# Patient Record
Sex: Female | Born: 1965 | Race: White | Hispanic: No | Marital: Married | Smoking: Former smoker
Health system: Southern US, Community
[De-identification: ages and names within clinical notes are randomized; demographics above are authoritative.]

## PROBLEM LIST (undated history)

## (undated) DIAGNOSIS — N870 Mild cervical dysplasia: Secondary | ICD-10-CM

## (undated) HISTORY — DX: Mild cervical dysplasia: N87.0

## (undated) HISTORY — PX: COLPOSCOPY: SHX161

## (undated) HISTORY — PX: CERVICAL BIOPSY  W/ LOOP ELECTRODE EXCISION: SUR135

---

## 2002-10-04 HISTORY — PX: AUGMENTATION MAMMAPLASTY: SUR837

## 2002-10-04 HISTORY — PX: ANTERIOR AND POSTERIOR VAGINAL REPAIR: SUR5

## 2003-07-15 ENCOUNTER — Other Ambulatory Visit: Admission: RE | Admit: 2003-07-15 | Discharge: 2003-07-15 | Payer: Self-pay | Admitting: Obstetrics and Gynecology

## 2004-01-20 ENCOUNTER — Emergency Department (HOSPITAL_COMMUNITY): Admission: EM | Admit: 2004-01-20 | Discharge: 2004-01-20 | Payer: Self-pay | Admitting: *Deleted

## 2004-09-16 ENCOUNTER — Encounter: Admission: RE | Admit: 2004-09-16 | Discharge: 2004-09-16 | Payer: Self-pay | Admitting: Obstetrics and Gynecology

## 2004-10-04 DIAGNOSIS — N870 Mild cervical dysplasia: Secondary | ICD-10-CM

## 2004-10-04 HISTORY — DX: Mild cervical dysplasia: N87.0

## 2005-10-20 ENCOUNTER — Encounter: Admission: RE | Admit: 2005-10-20 | Discharge: 2005-10-20 | Payer: Self-pay | Admitting: Obstetrics and Gynecology

## 2006-11-14 ENCOUNTER — Encounter: Admission: RE | Admit: 2006-11-14 | Discharge: 2006-11-14 | Payer: Self-pay | Admitting: Obstetrics and Gynecology

## 2007-11-16 ENCOUNTER — Encounter: Admission: RE | Admit: 2007-11-16 | Discharge: 2007-11-16 | Payer: Self-pay | Admitting: Internal Medicine

## 2008-03-20 ENCOUNTER — Other Ambulatory Visit: Admission: RE | Admit: 2008-03-20 | Discharge: 2008-03-20 | Payer: Self-pay | Admitting: Internal Medicine

## 2008-12-18 ENCOUNTER — Encounter: Admission: RE | Admit: 2008-12-18 | Discharge: 2008-12-18 | Payer: Self-pay | Admitting: Internal Medicine

## 2009-01-14 ENCOUNTER — Ambulatory Visit (HOSPITAL_COMMUNITY): Admission: RE | Admit: 2009-01-14 | Discharge: 2009-01-14 | Payer: Self-pay | Admitting: Internal Medicine

## 2009-05-05 ENCOUNTER — Ambulatory Visit: Payer: Self-pay | Admitting: Obstetrics and Gynecology

## 2009-05-05 ENCOUNTER — Other Ambulatory Visit: Admission: RE | Admit: 2009-05-05 | Discharge: 2009-05-05 | Payer: Self-pay | Admitting: Obstetrics and Gynecology

## 2009-05-05 ENCOUNTER — Encounter: Payer: Self-pay | Admitting: Obstetrics and Gynecology

## 2010-01-08 ENCOUNTER — Encounter: Admission: RE | Admit: 2010-01-08 | Discharge: 2010-01-08 | Payer: Self-pay | Admitting: Internal Medicine

## 2010-04-12 IMAGING — MG MM DIGITAL SCREENING W/ IMPLANTS
8 series · 8 of 8 positions shown · non-contrast
Comparison: none

DG SCREENING W/IMPLANTS
Bilateral CC and MLO view(s) were taken.
Technologist: Aoife Fg

DIGITAL SCREENING MAMMOGRAM W/IMPLANTS WITH CAD:
Saline implants are present in a submuscular location.  Standard and modified compression views are
obtained.
There are scattered fibroglandular densities.  No dominant masses or malignant type calcifications 
are identified.  Compared with prior studies.
Images were processed with CAD.

[R CC]
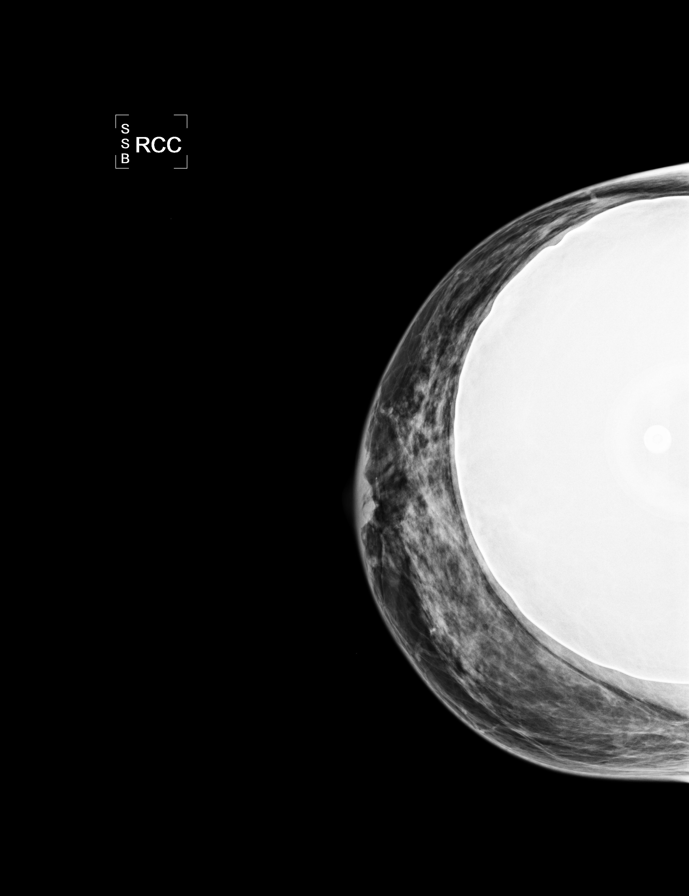

[L CC]
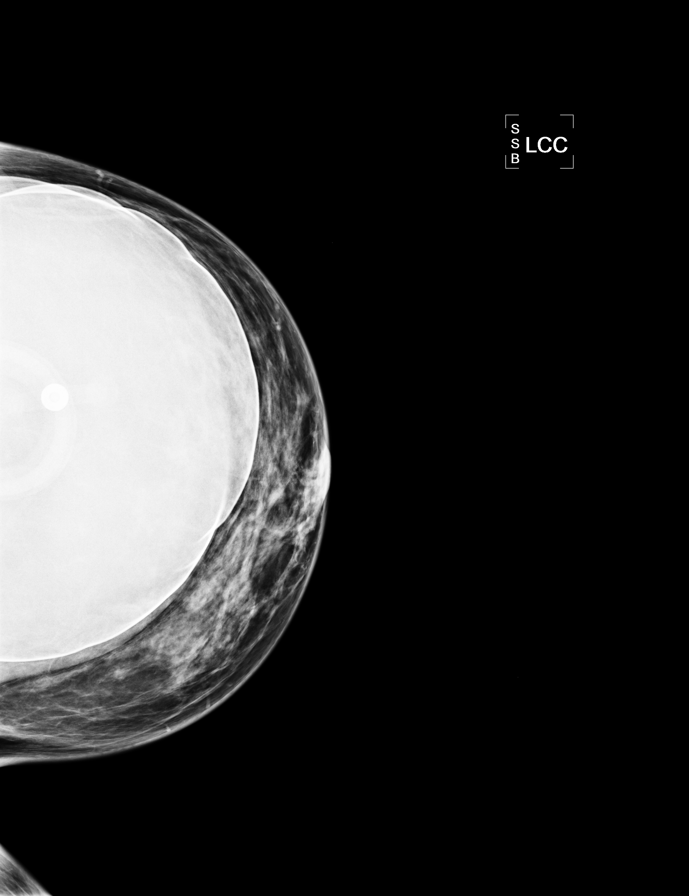

[L MLO]
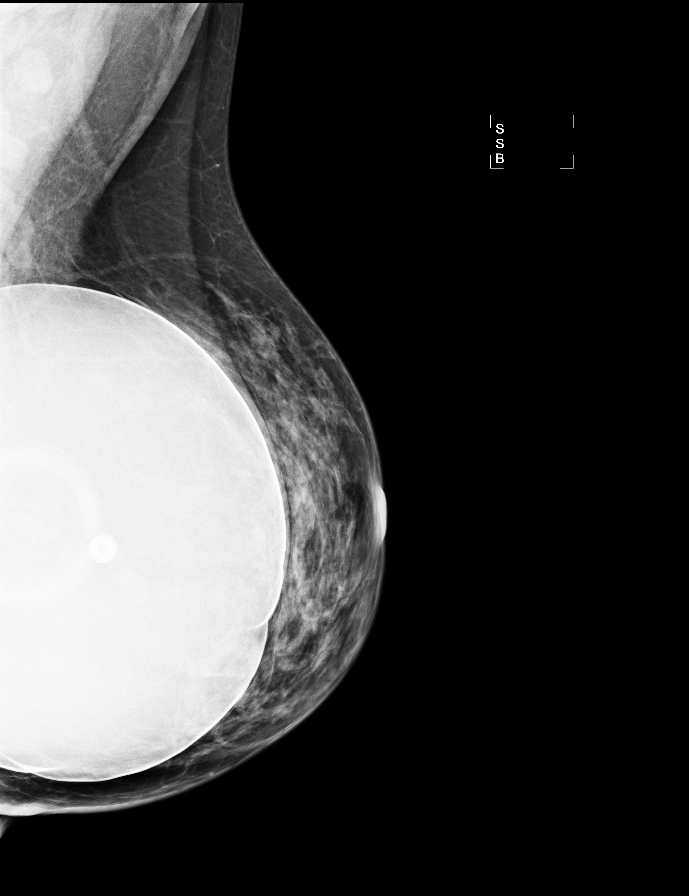

[R MLO]
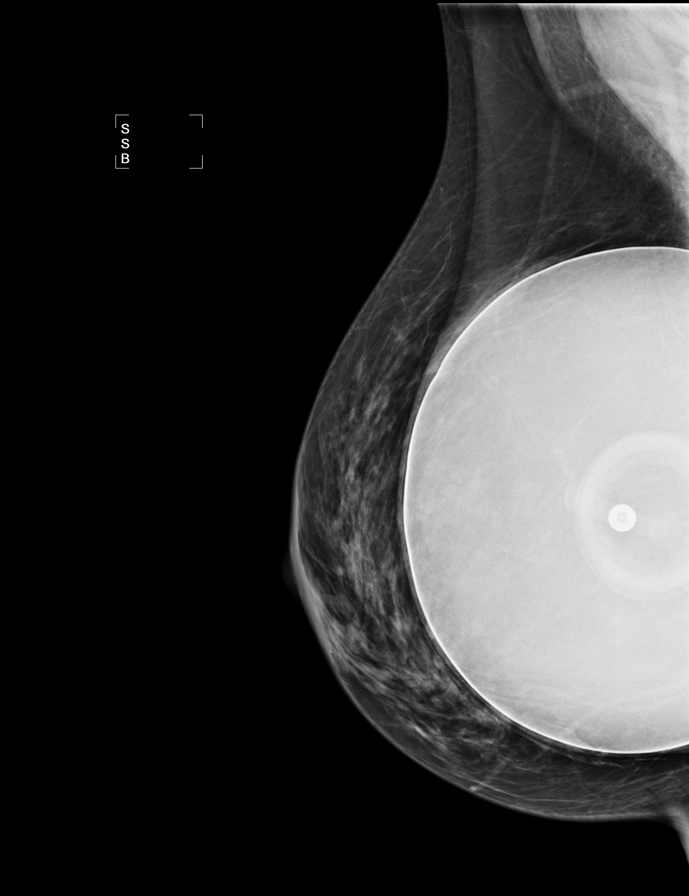

[R CCID]
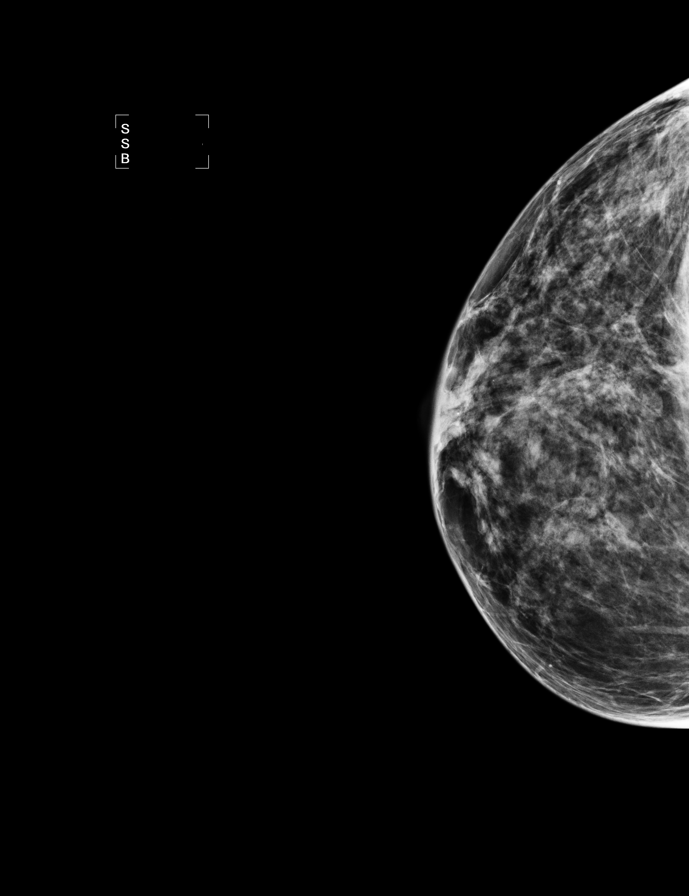

[L CCID]
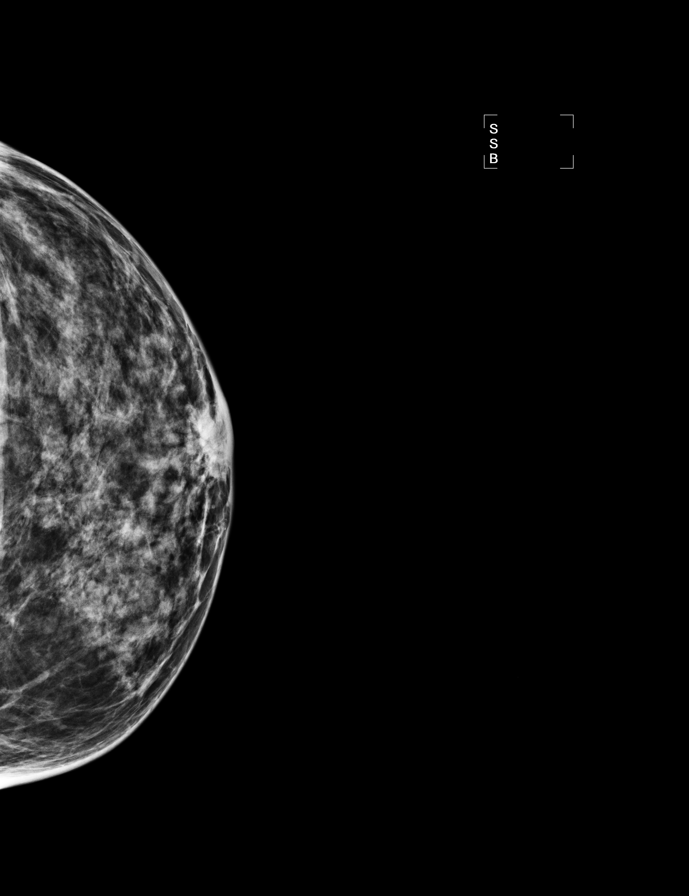

[L MLOID]
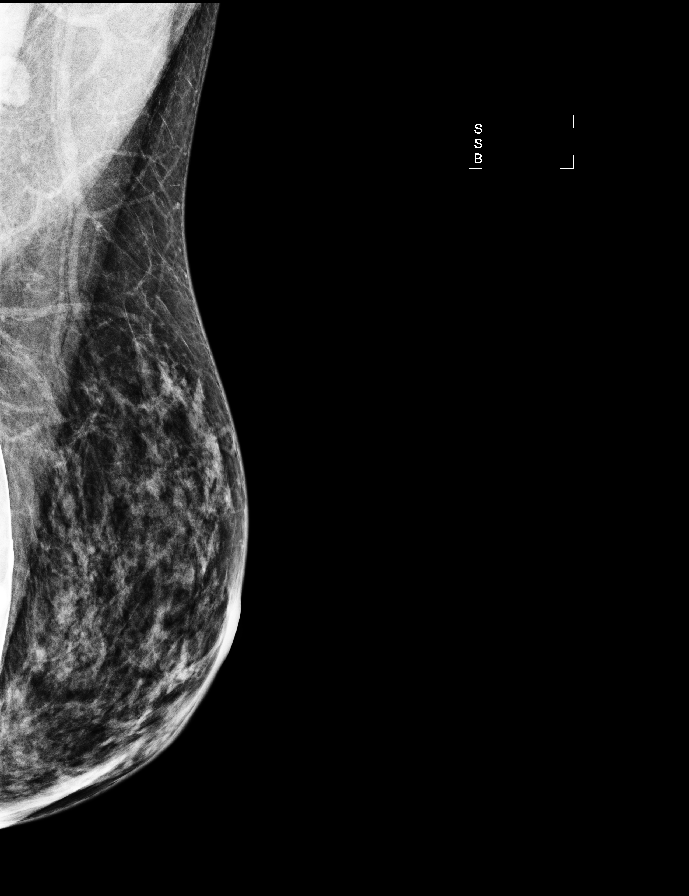

[R MLOID]
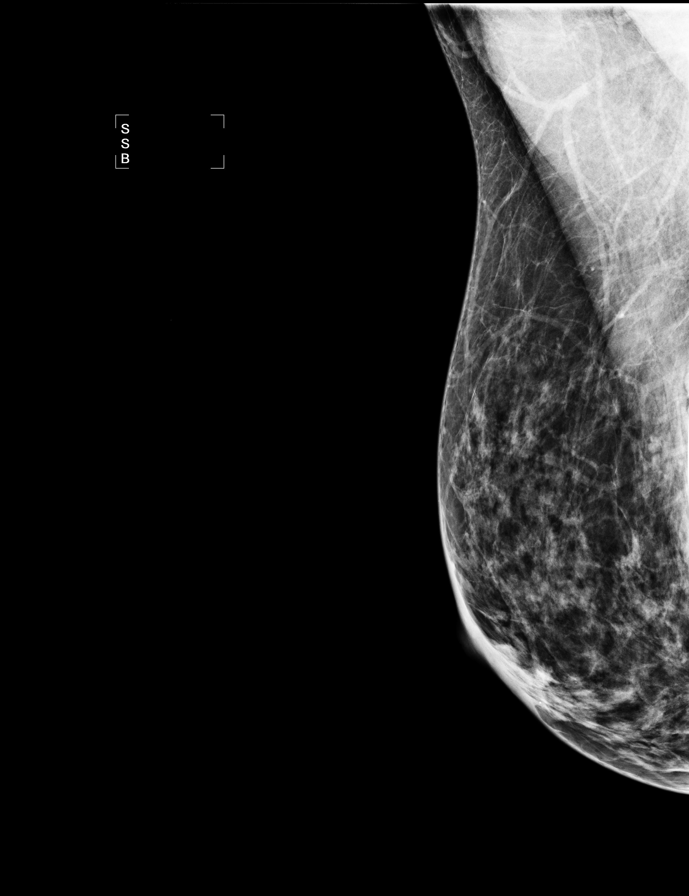

[8 of 8 positions shown; findings below may reference images not displayed]

IMPRESSION: No specific mammographic evidence of malignancy.  Next screening mammogram is recommended in one 
year.

A result letter of this screening mammogram will be mailed directly to the patient.

ASSESSMENT: Negative - BI-RADS 1

Screening mammogram in 1 year.
,

## 2010-07-03 ENCOUNTER — Other Ambulatory Visit: Admission: RE | Admit: 2010-07-03 | Discharge: 2010-07-03 | Payer: Self-pay | Admitting: Obstetrics and Gynecology

## 2010-07-03 ENCOUNTER — Ambulatory Visit: Payer: Self-pay | Admitting: Obstetrics and Gynecology

## 2010-10-25 ENCOUNTER — Encounter: Payer: Self-pay | Admitting: Internal Medicine

## 2010-12-30 ENCOUNTER — Other Ambulatory Visit: Payer: Self-pay | Admitting: Internal Medicine

## 2010-12-30 DIAGNOSIS — Z1231 Encounter for screening mammogram for malignant neoplasm of breast: Secondary | ICD-10-CM

## 2011-01-22 ENCOUNTER — Ambulatory Visit
Admission: RE | Admit: 2011-01-22 | Discharge: 2011-01-22 | Disposition: A | Discharge status: Home / Self Care | Payer: 59 | Source: Ambulatory Visit | Attending: Internal Medicine | Admitting: Internal Medicine

## 2011-01-22 DIAGNOSIS — Z1231 Encounter for screening mammogram for malignant neoplasm of breast: Secondary | ICD-10-CM

## 2011-02-05 ENCOUNTER — Other Ambulatory Visit: Payer: Self-pay | Admitting: Dermatology

## 2011-05-30 ENCOUNTER — Emergency Department (HOSPITAL_COMMUNITY): Payer: 59

## 2011-05-30 ENCOUNTER — Emergency Department (HOSPITAL_COMMUNITY)
Admission: EM | Admit: 2011-05-30 | Discharge: 2011-05-30 | Disposition: A | Discharge status: Home / Self Care | Payer: 59 | Attending: Emergency Medicine | Admitting: Emergency Medicine

## 2011-05-30 DIAGNOSIS — S61409A Unspecified open wound of unspecified hand, initial encounter: Secondary | ICD-10-CM | POA: Insufficient documentation

## 2011-05-30 DIAGNOSIS — Y92009 Unspecified place in unspecified non-institutional (private) residence as the place of occurrence of the external cause: Secondary | ICD-10-CM | POA: Insufficient documentation

## 2011-05-30 DIAGNOSIS — W260XXA Contact with knife, initial encounter: Secondary | ICD-10-CM | POA: Insufficient documentation

## 2011-05-30 DIAGNOSIS — W261XXA Contact with sword or dagger, initial encounter: Secondary | ICD-10-CM | POA: Insufficient documentation

## 2011-07-21 ENCOUNTER — Encounter: Payer: Self-pay | Admitting: Gynecology

## 2011-07-21 DIAGNOSIS — N870 Mild cervical dysplasia: Secondary | ICD-10-CM | POA: Insufficient documentation

## 2011-08-02 ENCOUNTER — Encounter: Payer: Self-pay | Admitting: Obstetrics and Gynecology

## 2011-08-02 ENCOUNTER — Ambulatory Visit (INDEPENDENT_AMBULATORY_CARE_PROVIDER_SITE_OTHER): Payer: 59 | Admitting: Obstetrics and Gynecology

## 2011-08-02 ENCOUNTER — Other Ambulatory Visit (HOSPITAL_COMMUNITY)
Admission: RE | Admit: 2011-08-02 | Discharge: 2011-08-02 | Disposition: A | Discharge status: Home / Self Care | Payer: 59 | Source: Ambulatory Visit | Attending: Obstetrics and Gynecology | Admitting: Obstetrics and Gynecology

## 2011-08-02 VITALS — BP 110/64 | Ht 62.0 in | Wt 116.0 lb

## 2011-08-02 DIAGNOSIS — Z01419 Encounter for gynecological examination (general) (routine) without abnormal findings: Secondary | ICD-10-CM

## 2011-08-02 DIAGNOSIS — N951 Menopausal and female climacteric states: Secondary | ICD-10-CM

## 2011-08-02 DIAGNOSIS — Z780 Asymptomatic menopausal state: Secondary | ICD-10-CM

## 2011-08-02 DIAGNOSIS — R82998 Other abnormal findings in urine: Secondary | ICD-10-CM

## 2011-08-02 MED ORDER — ESTRADIOL 1 MG PO TABS: 1.0000 mg | ORAL_TABLET | Freq: Every day | ORAL | Status: DC

## 2011-08-02 MED ORDER — MEDROXYPROGESTERONE ACETATE 5 MG PO TABS: ORAL_TABLET | ORAL | Status: DC

## 2011-08-02 MED ORDER — MEDROXYPROGESTERONE ACETATE 10 MG PO TABS: ORAL_TABLET | ORAL | Status: DC

## 2011-08-02 NOTE — Progress Notes (Signed)
Patient came to see me today for her annual GYN exam. It has been 10 months since her last period. She is having severe night sweats, fatigue. She is up-to-date on mammograms.  Physical examination: Kennon Portela present HEENT within normal limits. Neck: Thyroid not large. No masses. Supraclavicular nodes: not enlarged. Breasts: Examined in both sitting midline position. No skin changes and no masses. Abdomen: Soft no guarding rebound or masses or hernia. Pelvic: External: Within normal limits. BUS: Within normal limits. Vaginal:within normal limits. Good estrogen effect. No evidence of cystocele rectocele or enterocele. Cervix: clean. Uterus: Normal size and shape. Adnexa: No masses. Rectovaginal exam: Confirmatory and negative. Extremities: Within normal limits. Past  Assessment: Probable menopause  Plan: Discussed HRT in detail including pros and cons and risks. TSH and FSH drawn. Patient started on estradiol 1 mg daily. Patient started on medroxyprogesterone 5 mg for 12 days a month. Patient to start calcium and vitamin D. We will do a bone density if FSH is menopausal. Continue yearly mammograms.

## 2011-09-07 ENCOUNTER — Telehealth: Payer: Self-pay

## 2011-09-07 NOTE — Telephone Encounter (Signed)
PT DID NOT NEED RX'S AT RECENT AEX WITH YOU AND DOES NOT HAVE A CURRENT PCP AND WANTS TO KNOW IF YOU CAN NOW REFILL HER GENERIC CELEXA 40MG . AND VALIUM RX TOO.

## 2011-09-08 MED ORDER — CITALOPRAM HYDROBROMIDE 40 MG PO TABS: 40.0000 mg | ORAL_TABLET | Freq: Every day | ORAL | Status: DC

## 2011-09-08 MED ORDER — DIAZEPAM 5 MG PO TABS: 5.0000 mg | ORAL_TABLET | Freq: Four times a day (QID) | ORAL | Status: AC | PRN

## 2011-09-08 NOTE — Telephone Encounter (Signed)
rx sent, lm on vm patient informed.

## 2011-09-08 NOTE — Telephone Encounter (Signed)
Assuming patient is doing well on them with no side effects you can refill celexa and valium. The valium is 5 mgm. Give her 30 with 2 refills. Celexa give her 30 with 12 refills.

## 2011-12-24 ENCOUNTER — Other Ambulatory Visit: Payer: Self-pay | Admitting: Obstetrics and Gynecology

## 2012-01-20 ENCOUNTER — Other Ambulatory Visit: Payer: Self-pay | Admitting: Obstetrics and Gynecology

## 2012-01-20 DIAGNOSIS — Z1231 Encounter for screening mammogram for malignant neoplasm of breast: Secondary | ICD-10-CM

## 2012-02-15 ENCOUNTER — Ambulatory Visit
Admission: RE | Admit: 2012-02-15 | Discharge: 2012-02-15 | Disposition: A | Discharge status: Home / Self Care | Payer: PRIVATE HEALTH INSURANCE | Source: Ambulatory Visit | Attending: Obstetrics and Gynecology | Admitting: Obstetrics and Gynecology

## 2012-02-15 DIAGNOSIS — Z1231 Encounter for screening mammogram for malignant neoplasm of breast: Secondary | ICD-10-CM

## 2012-08-26 ENCOUNTER — Other Ambulatory Visit: Payer: Self-pay | Admitting: Obstetrics and Gynecology

## 2012-09-05 ENCOUNTER — Encounter: Payer: Self-pay | Admitting: Obstetrics and Gynecology

## 2012-09-05 ENCOUNTER — Ambulatory Visit (INDEPENDENT_AMBULATORY_CARE_PROVIDER_SITE_OTHER): Payer: PRIVATE HEALTH INSURANCE | Admitting: Obstetrics and Gynecology

## 2012-09-05 VITALS — BP 110/70 | Ht 61.5 in | Wt 116.0 lb

## 2012-09-05 DIAGNOSIS — N938 Other specified abnormal uterine and vaginal bleeding: Secondary | ICD-10-CM

## 2012-09-05 DIAGNOSIS — N8500 Endometrial hyperplasia, unspecified: Secondary | ICD-10-CM

## 2012-09-05 DIAGNOSIS — N949 Unspecified condition associated with female genital organs and menstrual cycle: Secondary | ICD-10-CM

## 2012-09-05 DIAGNOSIS — Z01419 Encounter for gynecological examination (general) (routine) without abnormal findings: Secondary | ICD-10-CM

## 2012-09-05 MED ORDER — DIAZEPAM 5 MG PO TABS: 5.0000 mg | ORAL_TABLET | Freq: Four times a day (QID) | ORAL | Status: DC | PRN

## 2012-09-05 NOTE — Progress Notes (Addendum)
Patient came to see me today for her annual GYN exam. Last year after an extended time of amenorrhea FSH was done. It was over 100. She was having severe hot flashes and fatigue. She was placed on 1 mg estradiol daily. She took cyclical progesterone the first 12 days of the month. After 6 months she stopped the progesterone Herself because her cycles were very heavy. They also been heavy premenopausally. She has been off the progesterone for 6 months. And she continues to have bleeding. It is more irregular. She's had several extended cycles of 10 days. She has remained on her estrogen and remains asymptomatic. She has a history of cervical dysplasia. In 2006 Dr. Richardean Sale did a LEEP. She has had normal pap smears since then. Her last Pap smear was 2012. She had a normal mammogram in April. She does her lab through PCP. She has never had a bone density. She is having no pelvic pain.  Physical examination: HEENT within normal limits. Neck: Thyroid not large. No masses. Supraclavicular nodes: not enlarged. Breasts: Examined in both sitting and lying  position. No skin changes and no masses. Status post implants. Abdomen: Soft no guarding rebound or masses or hernia. Pelvic: External: Within normal limits. BUS: Within normal limits. Vaginal:within normal limits. Good estrogen effect. No evidence of cystocele rectocele or enterocele. Cervix: clean. Uterus: Normal size and shape. Adnexa: No masses. Rectovaginal exam: Confirmatory and negative. Extremities: Within normal limits.  Assessment: #1. Abnormal postmenopausal bleeding on unopposed estrogen. #2. History of cervical dysplasia. #3. Menopausal symptoms  Plan:Due to the unopposed estrogen and  abnormal bleeding an endometrial biopsy was done. Her uterus is retroverted and due to the LEEP an os finder was needed. And excellent biopsy was obtained but it was painful. The patient said it was similar to when she had the LEEP. She was given a long-acting  naproxen. I think if we have to re- biopsy her we should use a paracervical block. I will call her with results of the biopsy. Options include lowering the dose of her estrogen with cyclical progesterone, combined estrogen-progesterone or  see how she does with no treatment. Obviously we will treat hyperplasia. Consider estrogen-serm when available next year. Continue yearly mammograms. Schedule bone density. Pap not done.The new Pap smear guidelines were discussed with the patient.   Addendum: Endometrial biopsy was fine without hyperplasia. She will first try half milligram estradiol daily and reinitiate Provera 5 mg for 12 days a month. If cycles to heavy she will consider duovee when available or continuous progestin.

## 2012-09-05 NOTE — Patient Instructions (Signed)
We will call you  with results. Schedule  bone density.

## 2012-09-06 LAB — URINALYSIS W MICROSCOPIC + REFLEX CULTURE
Bacteria, UA: NONE SEEN
Bilirubin Urine: NEGATIVE
Casts: NONE SEEN
Glucose, UA: NEGATIVE mg/dL
Hgb urine dipstick: NEGATIVE
Ketones, ur: NEGATIVE mg/dL
Leukocytes, UA: NEGATIVE
Nitrite: NEGATIVE
Protein, ur: NEGATIVE mg/dL
Specific Gravity, Urine: <1.005 — ABNORMAL LOW (ref 1.005–1.030)
Squamous Epithelial / HPF: NONE SEEN
Urobilinogen, UA: 0.2 mg/dL (ref 0.0–1.0)
pH: 8 (ref 5.0–8.0)

## 2012-09-07 MED ORDER — ESTRADIOL 1 MG PO TABS: 1.0000 mg | ORAL_TABLET | Freq: Every day | ORAL | Status: DC

## 2012-09-07 MED ORDER — MEDROXYPROGESTERONE ACETATE 5 MG PO TABS: ORAL_TABLET | ORAL | Status: DC

## 2012-09-07 NOTE — Addendum Note (Signed)
Addended by: Trellis Paganini on: 09/07/2012 08:14 AM   Modules accepted: Orders

## 2012-11-28 ENCOUNTER — Institutional Professional Consult (permissible substitution): Payer: PRIVATE HEALTH INSURANCE | Admitting: Gynecology

## 2013-01-11 ENCOUNTER — Other Ambulatory Visit: Payer: Self-pay

## 2013-01-11 DIAGNOSIS — Z1231 Encounter for screening mammogram for malignant neoplasm of breast: Secondary | ICD-10-CM

## 2013-02-06 ENCOUNTER — Ambulatory Visit (INDEPENDENT_AMBULATORY_CARE_PROVIDER_SITE_OTHER): Payer: PRIVATE HEALTH INSURANCE | Admitting: Gynecology

## 2013-02-06 ENCOUNTER — Encounter: Payer: Self-pay | Admitting: Gynecology

## 2013-02-06 DIAGNOSIS — Z79890 Hormone replacement therapy (postmenopausal): Secondary | ICD-10-CM

## 2013-02-06 DIAGNOSIS — N926 Irregular menstruation, unspecified: Secondary | ICD-10-CM

## 2013-02-06 MED ORDER — PROGESTERONE MICRONIZED 100 MG PO CAPS: 100.0000 mg | ORAL_CAPSULE | Freq: Every day | ORAL | Status: DC

## 2013-02-06 NOTE — Progress Notes (Signed)
Patient presents to discuss HRT. She is on estradiol 0.5 mg daily and Provera 5 mg daily for the first 12 days each month. She notes that she is having heavy bleeding at the end of the 12 days but this last cycle she bled almost continuously for 4 weeks but now has resolved. Underwent evaluation by Dr. Eda Paschal in December 2013 to include a endometrial biopsy that showed secretory endometrium. Previously had an elevated FSH with symptoms of hot flushes night sweats before starting on HRT.  Exam with Kim Asst. Abdomen soft nontender without masses guarding rebound organomegaly. Pelvic external BUS vagina normal. No bleeding. Cervix normal. Uterus retroverted normal size midline mobile nontender. Adnexa without masses or tenderness.  Assessment and plan: Perimenopausal with irregular bleeding. Recent biopsy benign. Discussed various options to include low-dose oral contraceptives like LoLoEstrin. She did smoke but quit about 9 years ago. The issues of increased risk of stroke heart attack DVT in smokers over the age of 60 reviewed. Unsure was stopped smoking these changes resolve or persist. Alternatives to include continuous HRT attempt also reviewed. After lengthy discussion we both agree on continuing her estradiol 0.5 mg and changing to Prometrium 100 mg nightly. She was having sporadic menses every 3 months or so previously which were more acceptable than the heavy monthly menses. Will keep a menstrual calendar and we'll see how she does. If she does well then she'll followup in December which is due for her annual. If not she'll followup sooner.

## 2013-02-06 NOTE — Patient Instructions (Addendum)
Start on new hormone replacement regimen of estrogen daily and Prometrium 100 mg nightly. Call me if any issues with significant irregular bleeding or any other issue.

## 2013-02-15 ENCOUNTER — Ambulatory Visit
Admission: RE | Admit: 2013-02-15 | Discharge: 2013-02-15 | Disposition: A | Discharge status: Home / Self Care | Payer: PRIVATE HEALTH INSURANCE | Source: Ambulatory Visit

## 2013-02-15 ENCOUNTER — Other Ambulatory Visit: Payer: Self-pay | Admitting: *Deleted

## 2013-02-15 DIAGNOSIS — Z1231 Encounter for screening mammogram for malignant neoplasm of breast: Secondary | ICD-10-CM

## 2013-02-15 NOTE — Telephone Encounter (Signed)
Last dispensed 09/19/12

## 2013-02-16 NOTE — Telephone Encounter (Signed)
I do not see documented indication for Valium and Dr. Verl Dicker notes. Check with patient as to indication

## 2013-02-16 NOTE — Telephone Encounter (Signed)
LM for pt

## 2013-03-01 ENCOUNTER — Other Ambulatory Visit: Payer: Self-pay

## 2013-03-01 MED ORDER — DIAZEPAM 5 MG PO TABS: 5.0000 mg | ORAL_TABLET | Freq: Four times a day (QID) | ORAL | Status: AC | PRN

## 2013-03-01 NOTE — Telephone Encounter (Signed)
Former Dr. Reece Agar patient. He gave her Rx for this last couple of years but did not note why she was using it. I spoke with patient and she said she uses "maybe a bottle a year" and she takes them when she has bad cramps.  Current on yearly, not due til Dec 2014.

## 2013-03-02 NOTE — Telephone Encounter (Signed)
Phoned in to pharmacy. 

## 2013-09-06 ENCOUNTER — Encounter: Payer: PRIVATE HEALTH INSURANCE | Admitting: Gynecology

## 2013-09-18 ENCOUNTER — Encounter: Payer: Self-pay | Admitting: Gynecology

## 2013-10-02 ENCOUNTER — Other Ambulatory Visit: Payer: Self-pay | Admitting: Obstetrics and Gynecology

## 2013-11-15 ENCOUNTER — Encounter: Payer: Self-pay | Admitting: Gynecology

## 2013-12-12 ENCOUNTER — Encounter: Payer: Self-pay | Admitting: Gynecology

## 2013-12-12 ENCOUNTER — Other Ambulatory Visit (HOSPITAL_COMMUNITY)
Admission: RE | Admit: 2013-12-12 | Discharge: 2013-12-12 | Disposition: A | Discharge status: Home / Self Care | Payer: PRIVATE HEALTH INSURANCE | Source: Ambulatory Visit | Attending: Gynecology | Admitting: Gynecology

## 2013-12-12 ENCOUNTER — Ambulatory Visit (INDEPENDENT_AMBULATORY_CARE_PROVIDER_SITE_OTHER): Payer: PRIVATE HEALTH INSURANCE | Admitting: Gynecology

## 2013-12-12 VITALS — BP 110/70 | Ht 62.0 in | Wt 117.0 lb

## 2013-12-12 DIAGNOSIS — Z01419 Encounter for gynecological examination (general) (routine) without abnormal findings: Secondary | ICD-10-CM

## 2013-12-12 DIAGNOSIS — Z1151 Encounter for screening for human papillomavirus (HPV): Secondary | ICD-10-CM | POA: Insufficient documentation

## 2013-12-12 DIAGNOSIS — Z79890 Hormone replacement therapy (postmenopausal): Secondary | ICD-10-CM

## 2013-12-12 NOTE — Patient Instructions (Signed)
Call me in several weeks in followup of the [facility] medication. Followup for your mammogram in May 2015. Followup for annual exam in one year.  Health Maintenance, Female A healthy lifestyle and preventative care can promote health and wellness.  Maintain regular health, dental, and eye exams.  Eat a healthy diet. Foods like vegetables, fruits, whole grains, low-fat dairy products, and lean protein foods contain the nutrients you need without too many calories. Decrease your intake of foods high in solid fats, added sugars, and salt. Get information about a proper diet from your caregiver, if necessary.  Regular physical exercise is one of the most important things you can do for your health. Most adults should get at least 150 minutes of moderate-intensity exercise (any activity that increases your heart rate and causes you to sweat) each week. In addition, most adults need muscle-strengthening exercises on 2 or more days a week.   Maintain a healthy weight. The body mass index (BMI) is a screening tool to identify possible weight problems. It provides an estimate of body fat based on height and weight. Your caregiver can help determine your BMI, and can help you achieve or maintain a healthy weight. For adults 20 years and older:  A BMI below 18.5 is considered underweight.  A BMI of 18.5 to 24.9 is normal.  A BMI of 25 to 29.9 is considered overweight.  A BMI of 30 and above is considered obese.  Maintain normal blood lipids and cholesterol by exercising and minimizing your intake of saturated fat. Eat a balanced diet with plenty of fruits and vegetables. Blood tests for lipids and cholesterol should begin at age 15 and be repeated every 5 years. If your lipid or cholesterol levels are high, you are over 50, or you are a high risk for heart disease, you may need your cholesterol levels checked more frequently.Ongoing high lipid and cholesterol levels should be treated with medicines if  diet and exercise are not effective.  If you smoke, find out from your caregiver how to quit. If you do not use tobacco, do not start.  Lung cancer screening is recommended for adults aged 72 80 years who are at high risk for developing lung cancer because of a history of smoking. Yearly low-dose computed tomography (CT) is recommended for people who have at least a 30-pack-year history of smoking and are a current smoker or have quit within the past 15 years. A pack year of smoking is smoking an average of 1 pack of cigarettes a day for 1 year (for example: 1 pack a day for 30 years or 2 packs a day for 15 years). Yearly screening should continue until the smoker has stopped smoking for at least 15 years. Yearly screening should also be stopped for people who develop a health problem that would prevent them from having lung cancer treatment.  If you are pregnant, do not drink alcohol. If you are breastfeeding, be very cautious about drinking alcohol. If you are not pregnant and choose to drink alcohol, do not exceed 1 drink per day. One drink is considered to be 12 ounces (355 mL) of beer, 5 ounces (148 mL) of wine, or 1.5 ounces (44 mL) of liquor.  Avoid use of street drugs. Do not share needles with anyone. Ask for help if you need support or instructions about stopping the use of drugs.  High blood pressure causes heart disease and increases the risk of stroke. Blood pressure should be checked at least every 1  to 2 years. Ongoing high blood pressure should be treated with medicines, if weight loss and exercise are not effective.  If you are 70 to 50 years old, ask your caregiver if you should take aspirin to prevent strokes.  Diabetes screening involves taking a blood sample to check your fasting blood sugar level. This should be done once every 3 years, after age 52, if you are within normal weight and without risk factors for diabetes. Testing should be considered at a younger age or be carried  out more frequently if you are overweight and have at least 1 risk factor for diabetes.  Breast cancer screening is essential preventative care for women. You should practice "breast self-awareness." This means understanding the normal appearance and feel of your breasts and may include breast self-examination. Any changes detected, no matter how small, should be reported to a caregiver. Women in their 57s and 30s should have a clinical breast exam (CBE) by a caregiver as part of a regular health exam every 1 to 3 years. After age 72, women should have a CBE every year. Starting at age 51, women should consider having a mammogram (breast X-ray) every year. Women who have a family history of breast cancer should talk to their caregiver about genetic screening. Women at a high risk of breast cancer should talk to their caregiver about having an MRI and a mammogram every year.  Breast cancer gene (BRCA)-related cancer risk assessment is recommended for women who have family members with BRCA-related cancers. BRCA-related cancers include breast, ovarian, tubal, and peritoneal cancers. Having family members with these cancers may be associated with an increased risk for harmful changes (mutations) in the breast cancer genes BRCA1 and BRCA2. Results of the assessment will determine the need for genetic counseling and BRCA1 and BRCA2 testing.  The Pap test is a screening test for cervical cancer. Women should have a Pap test starting at age 65. Between ages 64 and 69, Pap tests should be repeated every 2 years. Beginning at age 46, you should have a Pap test every 3 years as long as the past 3 Pap tests have been normal. If you had a hysterectomy for a problem that was not cancer or a condition that could lead to cancer, then you no longer need Pap tests. If you are between ages 70 and 24, and you have had normal Pap tests going back 10 years, you no longer need Pap tests. If you have had past treatment for cervical  cancer or a condition that could lead to cancer, you need Pap tests and screening for cancer for at least 20 years after your treatment. If Pap tests have been discontinued, risk factors (such as a new sexual partner) need to be reassessed to determine if screening should be resumed. Some women have medical problems that increase the chance of getting cervical cancer. In these cases, your caregiver may recommend more frequent screening and Pap tests.  The human papillomavirus (HPV) test is an additional test that may be used for cervical cancer screening. The HPV test looks for the virus that can cause the cell changes on the cervix. The cells collected during the Pap test can be tested for HPV. The HPV test could be used to screen women aged 5 years and older, and should be used in women of any age who have unclear Pap test results. After the age of 44, women should have HPV testing at the same frequency as a Pap test.  Colorectal  cancer can be detected and often prevented. Most routine colorectal cancer screening begins at the age of 8 and continues through age 30. However, your caregiver may recommend screening at an earlier age if you have risk factors for colon cancer. On a yearly basis, your caregiver may provide home test kits to check for hidden blood in the stool. Use of a small camera at the end of a tube, to directly examine the colon (sigmoidoscopy or colonoscopy), can detect the earliest forms of colorectal cancer. Talk to your caregiver about this at age 29, when routine screening begins. Direct examination of the colon should be repeated every 5 to 10 years through age 8, unless early forms of pre-cancerous polyps or small growths are found.  Hepatitis C blood testing is recommended for all people born from [birth year] through 1967 and any individual with known risks for hepatitis C.  Practice safe sex. Use condoms and avoid high-risk sexual practices to reduce the spread of sexually transmitted  infections (STIs). Sexually active women aged 37 and younger should be checked for Chlamydia, which is a common sexually transmitted infection. Older women with new or multiple partners should also be tested for Chlamydia. Testing for other STIs is recommended if you are sexually active and at increased risk.  Osteoporosis is a disease in which the bones lose minerals and strength with aging. This can result in serious bone fractures. The risk of osteoporosis can be identified using a bone density scan. Women ages 9 and over and women at risk for fractures or osteoporosis should discuss screening with their caregivers. Ask your caregiver whether you should be taking a calcium supplement or vitamin D to reduce the rate of osteoporosis.  Menopause can be associated with physical symptoms and risks. Hormone replacement therapy is available to decrease symptoms and risks. You should talk to your caregiver about whether hormone replacement therapy is right for you.  Use sunscreen. Apply sunscreen liberally and repeatedly throughout the day. You should seek shade when your shadow is shorter than you. Protect yourself by wearing long sleeves, pants, a wide-brimmed hat, and sunglasses year round, whenever you are outdoors.  Notify your caregiver of new moles or changes in moles, especially if there is a change in shape or color. Also notify your caregiver if a mole is larger than the size of a pencil eraser.  Stay current with your immunizations. Document Released: 04/05/2011 Document Revised: 01/15/2013 Document Reviewed: 04/05/2011 [facility] Patient Information 2014 [place].

## 2013-12-12 NOTE — Progress Notes (Signed)
Breanna Hartman 03-16-1966 [id number]        48 y.o.  [id number] for annual exam.  Former patient of Dr. Cherylann Banas. Several issues below.  Past medical history,surgical history, problem list, medications, allergies, family history and social history were all reviewed and documented in the EPIC chart.  ROS:  Performed and pertinent positives and negatives are included in the history, assessment and plan .  Exam: Kim assistant Filed Vitals:   12/12/13 1158  BP: 110/70  Height: 5\' 2"  (1.575 m)  Weight: 117 lb (53.071 kg)   General appearance  Normal Skin grossly normal Head/Neck normal with no cervical or supraclavicular adenopathy thyroid normal Lungs  clear Cardiac RR, without RMG Abdominal  soft, nontender, without masses, organomegaly or hernia Breasts  examined lying and sitting without masses, retractions, discharge or axillary adenopathy. Bilateral implants with well healed lift scars. Pelvic  Ext/BUS/vagina normal  Cervix normal with Pap/HPV done  Uterus anteverted, normal size, shape and contour, midline and mobile nontender   Adnexa  Without masses or tenderness    Anus and perineum  Normal   Rectovaginal  Normal sphincter tone without palpated masses or tenderness.    Assessment/Plan:  48 y.o. [id number] female for annual exam.   1. Postmenopausal on HRT.  Taking estradiol 1 mg daily. Had been doing intermittent progesterone withdrawals previously but stopped it because she did not like having a period. Dr. Cherylann Banas did an endometrial sample due to unopposed estrogen use 09/2012 which showed secratory endometrium. I switched her to nightly Prometrium 100 mg nightly which made her sleepy but she was forgetting to take it night and she has not taken it for several months.  I again reviewed the whole issue of HRT with her to include the WHI study with increased risk of stroke, heart attack, DVT and breast cancer. The ACOG and NAMS statements for lowest dose for the shortest period of  time reviewed. Transdermal versus oral first-pass effect benefit discussed. I also discussed the need for endometrial protection. Options to include intermittent progesterone, continuous low dose Provera, Prometrium, Duavee. The issues of Duavee to include thrombosis risk breast bone endometrial protection/risks all discussed. Patient interested in trying. I gave her 3 weeks samples and she going to call me at the end of the sample for prescription assuming she does well. If not then will go back to an alternative progesterone regimen. She has done no bleeding at all. 2. Mammography 02/2013. Patient reminded to schedule this coming May. SBE monthly reviewed. 3. Pap smear 2012. Pap/HPV today. 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 Normal Pap smears since then. Assuming normal then continue with 3-5 year screening interval. 4. Health maintenance. Patient recently had general medical exam to include lab work through her primary physician's office. The blood work done today. Followup in several weeks with her response to [facility].   Note: This document was prepared with digital dictation and possible smart phrase technology. Any transcriptional errors that result from this process are unintentional.   Anastasio Auerbach MD, 12:23 PM 12/12/2013

## 2013-12-12 NOTE — Addendum Note (Signed)
Addended by: Nelva Nay on: 12/12/2013 12:32 PM   Modules accepted: Orders

## 2014-01-17 ENCOUNTER — Other Ambulatory Visit: Payer: Self-pay | Admitting: Gynecology

## 2014-01-17 ENCOUNTER — Telehealth: Payer: Self-pay

## 2014-01-17 MED ORDER — CONJ ESTROGENS-BAZEDOXIFENE 0.45-20 MG PO TABS: 1.0000 | ORAL_TABLET | Freq: Every day | ORAL | Status: AC

## 2014-01-17 NOTE — Telephone Encounter (Signed)
I am not familiar with this Rx.  Is this daily?

## 2014-01-17 NOTE — Telephone Encounter (Signed)
Yes it is Duavee #30 one by mouth daily refill x11

## 2014-01-17 NOTE — Telephone Encounter (Signed)
Okay to refill through her next annual exam 

## 2014-01-17 NOTE — Telephone Encounter (Signed)
At 12/12/13 visit you provided her with [facility] samples. She likes it and would like to get a prescription sent to her pharmacy so that she may continue on it.

## 2014-01-17 NOTE — Telephone Encounter (Signed)
Rx sent. Patient informed. 

## 2014-01-25 ENCOUNTER — Telehealth: Payer: Self-pay | Admitting: *Deleted

## 2014-01-25 NOTE — Telephone Encounter (Signed)
Pt Rx for Breanna Hartman is too expensive at $100, pt would like differnent medication. Please advise

## 2014-01-28 NOTE — Telephone Encounter (Signed)
Options would be her Estrace 1 mg with Prometrium 100 mg nightly or she could switch to something totally different like Activella equivalent which has both the estrogen and progesterone in it.

## 2014-01-29 MED ORDER — ESTRADIOL-NORETHINDRONE ACET 1-0.5 MG PO TABS: 1.0000 | ORAL_TABLET | Freq: Every day | ORAL | Status: DC

## 2014-01-29 NOTE — Telephone Encounter (Signed)
I would use the Activella generic 1/0.5

## 2014-01-29 NOTE — Telephone Encounter (Signed)
rx sent

## 2014-01-29 NOTE — Telephone Encounter (Signed)
Pt would like to have Activella, which dose would you like prescribed 1-0.5 or 0.5-1?

## 2014-01-31 ENCOUNTER — Telehealth: Payer: Self-pay

## 2014-01-31 NOTE — Telephone Encounter (Signed)
Patient called stating that Rx for estrogen that was called in 2 days ago is going to cost her $200. The one prior to that was $125. She states there is bound to be something she can afford. I explained to her that when MD prescribes we have no way of knowing how much her ins will pay on any particular medication.  I told her she needs to call and talk with them and see if they have a formulary or drug list and ask them to help her find something more cost efficient. Otherwise, I told her we will just be prescribing and waiting to see cost.  She said she will call and check on it and get back with [place].

## 2014-02-05 ENCOUNTER — Other Ambulatory Visit: Payer: Self-pay

## 2014-02-05 DIAGNOSIS — Z1231 Encounter for screening mammogram for malignant neoplasm of breast: Secondary | ICD-10-CM

## 2014-02-19 ENCOUNTER — Telehealth: Payer: Self-pay | Admitting: *Deleted

## 2014-02-19 ENCOUNTER — Encounter (INDEPENDENT_AMBULATORY_CARE_PROVIDER_SITE_OTHER): Payer: Self-pay

## 2014-02-19 ENCOUNTER — Ambulatory Visit
Admission: RE | Admit: 2014-02-19 | Discharge: 2014-02-19 | Disposition: A | Discharge status: Home / Self Care | Payer: PRIVATE HEALTH INSURANCE | Source: Ambulatory Visit

## 2014-02-19 DIAGNOSIS — Z1231 Encounter for screening mammogram for malignant neoplasm of breast: Secondary | ICD-10-CM

## 2014-02-19 NOTE — Telephone Encounter (Signed)
Pt has had trouble with cost of HRT , has been prescribed Duavee which was $100, Activelle which was $200 had annual in March 2015. Pt bought a list of preferred medication to chose from insurance company which are: divigel,enjuvia, Estraderm, premarin, Premphase, Prempro,vivelle dot. Per note "pt has been taking estradiol 1 mg had been doing intermittent progesterone withdrawals previously but stopped it because she did not like having a period". Please advise

## 2014-02-21 NOTE — Telephone Encounter (Signed)
OV to discuss options.

## 2014-02-22 NOTE — Telephone Encounter (Signed)
Left detailed message on her cell voice mail and provided office phone and option 2 for appts.

## 2014-02-26 ENCOUNTER — Telehealth: Payer: Self-pay | Admitting: *Deleted

## 2014-02-26 NOTE — Telephone Encounter (Signed)
Pt informed with the below note, she declined to make appointment now.

## 2014-02-26 NOTE — Telephone Encounter (Signed)
Pt called to follow up from telephone encounter 02/19/14, she called back stating she doesn't feel OV needed just would like Rx for HRT. I explained this is what you recommend. States she is going crazy due to not having HRT.  Please advise

## 2014-02-26 NOTE — Telephone Encounter (Signed)
There are various regimens on how to take HRT. She did not tolerate just room regimens well but progesterone is necessary to prevent uterine cancer from developing by taking estrogen alone. I would prefer to discuss the various regimens with her to see what she is comfortable with instead of me just picking one.  This should not be a long discussion.

## 2014-07-02 ENCOUNTER — Other Ambulatory Visit: Payer: Self-pay | Admitting: Gastroenterology

## 2014-08-05 ENCOUNTER — Encounter: Payer: Self-pay | Admitting: Gynecology

## 2014-12-16 ENCOUNTER — Encounter: Payer: PRIVATE HEALTH INSURANCE | Admitting: Gynecology

## 2015-07-02 ENCOUNTER — Other Ambulatory Visit: Payer: Self-pay

## 2015-07-02 DIAGNOSIS — Z1231 Encounter for screening mammogram for malignant neoplasm of breast: Secondary | ICD-10-CM

## 2015-07-24 ENCOUNTER — Ambulatory Visit: Payer: PRIVATE HEALTH INSURANCE

## 2015-08-14 ENCOUNTER — Other Ambulatory Visit: Payer: Self-pay

## 2015-08-14 DIAGNOSIS — Z1231 Encounter for screening mammogram for malignant neoplasm of breast: Secondary | ICD-10-CM

## 2015-09-02 ENCOUNTER — Ambulatory Visit: Payer: PRIVATE HEALTH INSURANCE | Admitting: Sports Medicine

## 2015-09-22 ENCOUNTER — Ambulatory Visit: Payer: PRIVATE HEALTH INSURANCE

## 2015-10-22 ENCOUNTER — Ambulatory Visit: Payer: PRIVATE HEALTH INSURANCE

## 2015-11-04 ENCOUNTER — Ambulatory Visit: Payer: PRIVATE HEALTH INSURANCE

## 2016-10-06 DIAGNOSIS — N390 Urinary tract infection, site not specified: Secondary | ICD-10-CM | POA: Diagnosis not present

## 2016-10-06 DIAGNOSIS — Z0000 Encounter for general adult medical examination without abnormal findings: Secondary | ICD-10-CM | POA: Diagnosis not present

## 2016-10-11 DIAGNOSIS — Z0000 Encounter for general adult medical examination without abnormal findings: Secondary | ICD-10-CM | POA: Diagnosis not present

## 2016-10-11 DIAGNOSIS — Z1389 Encounter for screening for other disorder: Secondary | ICD-10-CM | POA: Diagnosis not present

## 2016-10-11 DIAGNOSIS — Z23 Encounter for immunization: Secondary | ICD-10-CM | POA: Diagnosis not present

## 2016-10-11 DIAGNOSIS — R030 Elevated blood-pressure reading, without diagnosis of hypertension: Secondary | ICD-10-CM | POA: Diagnosis not present

## 2016-10-11 DIAGNOSIS — M545 Low back pain: Secondary | ICD-10-CM | POA: Diagnosis not present

## 2016-10-11 DIAGNOSIS — E538 Deficiency of other specified B group vitamins: Secondary | ICD-10-CM | POA: Diagnosis not present

## 2016-10-25 DIAGNOSIS — M9903 Segmental and somatic dysfunction of lumbar region: Secondary | ICD-10-CM | POA: Diagnosis not present

## 2016-10-25 DIAGNOSIS — M5137 Other intervertebral disc degeneration, lumbosacral region: Secondary | ICD-10-CM | POA: Diagnosis not present

## 2016-10-25 DIAGNOSIS — M9905 Segmental and somatic dysfunction of pelvic region: Secondary | ICD-10-CM | POA: Diagnosis not present

## 2016-10-27 DIAGNOSIS — M5137 Other intervertebral disc degeneration, lumbosacral region: Secondary | ICD-10-CM | POA: Diagnosis not present

## 2016-10-27 DIAGNOSIS — M9905 Segmental and somatic dysfunction of pelvic region: Secondary | ICD-10-CM | POA: Diagnosis not present

## 2016-10-27 DIAGNOSIS — M9903 Segmental and somatic dysfunction of lumbar region: Secondary | ICD-10-CM | POA: Diagnosis not present

## 2016-10-28 DIAGNOSIS — M9905 Segmental and somatic dysfunction of pelvic region: Secondary | ICD-10-CM | POA: Diagnosis not present

## 2016-10-28 DIAGNOSIS — M5137 Other intervertebral disc degeneration, lumbosacral region: Secondary | ICD-10-CM | POA: Diagnosis not present

## 2016-10-28 DIAGNOSIS — M9903 Segmental and somatic dysfunction of lumbar region: Secondary | ICD-10-CM | POA: Diagnosis not present

## 2016-10-29 DIAGNOSIS — M9903 Segmental and somatic dysfunction of lumbar region: Secondary | ICD-10-CM | POA: Diagnosis not present

## 2016-10-29 DIAGNOSIS — M9905 Segmental and somatic dysfunction of pelvic region: Secondary | ICD-10-CM | POA: Diagnosis not present

## 2016-10-29 DIAGNOSIS — M5137 Other intervertebral disc degeneration, lumbosacral region: Secondary | ICD-10-CM | POA: Diagnosis not present

## 2016-11-02 DIAGNOSIS — M9903 Segmental and somatic dysfunction of lumbar region: Secondary | ICD-10-CM | POA: Diagnosis not present

## 2016-11-02 DIAGNOSIS — M5137 Other intervertebral disc degeneration, lumbosacral region: Secondary | ICD-10-CM | POA: Diagnosis not present

## 2016-11-02 DIAGNOSIS — M9905 Segmental and somatic dysfunction of pelvic region: Secondary | ICD-10-CM | POA: Diagnosis not present

## 2016-11-03 DIAGNOSIS — M9905 Segmental and somatic dysfunction of pelvic region: Secondary | ICD-10-CM | POA: Diagnosis not present

## 2016-11-03 DIAGNOSIS — M9903 Segmental and somatic dysfunction of lumbar region: Secondary | ICD-10-CM | POA: Diagnosis not present

## 2016-11-03 DIAGNOSIS — M5137 Other intervertebral disc degeneration, lumbosacral region: Secondary | ICD-10-CM | POA: Diagnosis not present

## 2016-11-05 DIAGNOSIS — M9903 Segmental and somatic dysfunction of lumbar region: Secondary | ICD-10-CM | POA: Diagnosis not present

## 2016-11-05 DIAGNOSIS — M9905 Segmental and somatic dysfunction of pelvic region: Secondary | ICD-10-CM | POA: Diagnosis not present

## 2016-11-05 DIAGNOSIS — M5137 Other intervertebral disc degeneration, lumbosacral region: Secondary | ICD-10-CM | POA: Diagnosis not present

## 2016-11-09 DIAGNOSIS — M5137 Other intervertebral disc degeneration, lumbosacral region: Secondary | ICD-10-CM | POA: Diagnosis not present

## 2016-11-09 DIAGNOSIS — M9903 Segmental and somatic dysfunction of lumbar region: Secondary | ICD-10-CM | POA: Diagnosis not present

## 2016-11-09 DIAGNOSIS — M9905 Segmental and somatic dysfunction of pelvic region: Secondary | ICD-10-CM | POA: Diagnosis not present

## 2016-11-10 DIAGNOSIS — M5137 Other intervertebral disc degeneration, lumbosacral region: Secondary | ICD-10-CM | POA: Diagnosis not present

## 2016-11-10 DIAGNOSIS — M9905 Segmental and somatic dysfunction of pelvic region: Secondary | ICD-10-CM | POA: Diagnosis not present

## 2016-11-10 DIAGNOSIS — M9903 Segmental and somatic dysfunction of lumbar region: Secondary | ICD-10-CM | POA: Diagnosis not present

## 2016-11-11 DIAGNOSIS — M9905 Segmental and somatic dysfunction of pelvic region: Secondary | ICD-10-CM | POA: Diagnosis not present

## 2016-11-11 DIAGNOSIS — M9903 Segmental and somatic dysfunction of lumbar region: Secondary | ICD-10-CM | POA: Diagnosis not present

## 2016-11-11 DIAGNOSIS — M5137 Other intervertebral disc degeneration, lumbosacral region: Secondary | ICD-10-CM | POA: Diagnosis not present

## 2016-11-15 DIAGNOSIS — M9905 Segmental and somatic dysfunction of pelvic region: Secondary | ICD-10-CM | POA: Diagnosis not present

## 2016-11-15 DIAGNOSIS — M9903 Segmental and somatic dysfunction of lumbar region: Secondary | ICD-10-CM | POA: Diagnosis not present

## 2016-11-15 DIAGNOSIS — M5137 Other intervertebral disc degeneration, lumbosacral region: Secondary | ICD-10-CM | POA: Diagnosis not present

## 2016-11-24 DIAGNOSIS — M4316 Spondylolisthesis, lumbar region: Secondary | ICD-10-CM | POA: Diagnosis not present

## 2016-12-02 DIAGNOSIS — Z1231 Encounter for screening mammogram for malignant neoplasm of breast: Secondary | ICD-10-CM | POA: Diagnosis not present

## 2016-12-02 DIAGNOSIS — Z01419 Encounter for gynecological examination (general) (routine) without abnormal findings: Secondary | ICD-10-CM | POA: Diagnosis not present

## 2016-12-02 DIAGNOSIS — Z6822 Body mass index (BMI) 22.0-22.9, adult: Secondary | ICD-10-CM | POA: Diagnosis not present

## 2016-12-28 DIAGNOSIS — R509 Fever, unspecified: Secondary | ICD-10-CM | POA: Diagnosis not present

## 2016-12-28 DIAGNOSIS — J029 Acute pharyngitis, unspecified: Secondary | ICD-10-CM | POA: Diagnosis not present

## 2017-08-02 DIAGNOSIS — Z85828 Personal history of other malignant neoplasm of skin: Secondary | ICD-10-CM | POA: Diagnosis not present

## 2017-08-02 DIAGNOSIS — L821 Other seborrheic keratosis: Secondary | ICD-10-CM | POA: Diagnosis not present

## 2017-08-02 DIAGNOSIS — L570 Actinic keratosis: Secondary | ICD-10-CM | POA: Diagnosis not present

## 2017-08-02 DIAGNOSIS — D225 Melanocytic nevi of trunk: Secondary | ICD-10-CM | POA: Diagnosis not present

## 2017-08-22 DIAGNOSIS — M549 Dorsalgia, unspecified: Secondary | ICD-10-CM | POA: Diagnosis not present

## 2017-08-22 DIAGNOSIS — Z6822 Body mass index (BMI) 22.0-22.9, adult: Secondary | ICD-10-CM | POA: Diagnosis not present

## 2017-09-14 DIAGNOSIS — Z6822 Body mass index (BMI) 22.0-22.9, adult: Secondary | ICD-10-CM | POA: Diagnosis not present

## 2017-09-14 DIAGNOSIS — M549 Dorsalgia, unspecified: Secondary | ICD-10-CM | POA: Diagnosis not present

## 2017-12-26 DIAGNOSIS — Z6822 Body mass index (BMI) 22.0-22.9, adult: Secondary | ICD-10-CM | POA: Diagnosis not present

## 2017-12-26 DIAGNOSIS — Z1231 Encounter for screening mammogram for malignant neoplasm of breast: Secondary | ICD-10-CM | POA: Diagnosis not present

## 2017-12-26 DIAGNOSIS — Z01419 Encounter for gynecological examination (general) (routine) without abnormal findings: Secondary | ICD-10-CM | POA: Diagnosis not present

## 2018-03-06 DIAGNOSIS — J019 Acute sinusitis, unspecified: Secondary | ICD-10-CM | POA: Diagnosis not present

## 2018-08-01 DIAGNOSIS — L300 Nummular dermatitis: Secondary | ICD-10-CM | POA: Diagnosis not present

## 2018-08-01 DIAGNOSIS — D692 Other nonthrombocytopenic purpura: Secondary | ICD-10-CM | POA: Diagnosis not present

## 2018-08-01 DIAGNOSIS — C44619 Basal cell carcinoma of skin of left upper limb, including shoulder: Secondary | ICD-10-CM | POA: Diagnosis not present

## 2018-08-01 DIAGNOSIS — Z85828 Personal history of other malignant neoplasm of skin: Secondary | ICD-10-CM | POA: Diagnosis not present

## 2018-09-12 DIAGNOSIS — C44619 Basal cell carcinoma of skin of left upper limb, including shoulder: Secondary | ICD-10-CM | POA: Diagnosis not present

## 2019-02-05 DIAGNOSIS — R82998 Other abnormal findings in urine: Secondary | ICD-10-CM | POA: Diagnosis not present

## 2019-02-05 DIAGNOSIS — Z0000 Encounter for general adult medical examination without abnormal findings: Secondary | ICD-10-CM | POA: Diagnosis not present

## 2019-02-08 DIAGNOSIS — D7589 Other specified diseases of blood and blood-forming organs: Secondary | ICD-10-CM | POA: Diagnosis not present

## 2019-06-22 ENCOUNTER — Other Ambulatory Visit: Payer: Self-pay | Admitting: Internal Medicine

## 2019-06-22 DIAGNOSIS — R1011 Right upper quadrant pain: Secondary | ICD-10-CM

## 2019-06-26 ENCOUNTER — Ambulatory Visit
Admission: RE | Admit: 2019-06-26 | Discharge: 2019-06-26 | Disposition: A | Discharge status: Home / Self Care | Payer: 59 | Source: Ambulatory Visit | Attending: Internal Medicine | Admitting: Internal Medicine

## 2019-06-26 DIAGNOSIS — R1011 Right upper quadrant pain: Secondary | ICD-10-CM

## 2019-06-27 ENCOUNTER — Other Ambulatory Visit: Payer: Self-pay | Admitting: Internal Medicine

## 2019-06-27 DIAGNOSIS — R1011 Right upper quadrant pain: Secondary | ICD-10-CM

## 2019-06-28 ENCOUNTER — Other Ambulatory Visit: Payer: Self-pay | Admitting: Internal Medicine

## 2019-06-28 ENCOUNTER — Ambulatory Visit
Admission: RE | Admit: 2019-06-28 | Discharge: 2019-06-28 | Disposition: A | Discharge status: Home / Self Care | Payer: 59 | Source: Ambulatory Visit | Attending: Internal Medicine | Admitting: Internal Medicine

## 2019-06-28 DIAGNOSIS — R079 Chest pain, unspecified: Secondary | ICD-10-CM

## 2019-06-28 MED ORDER — IOPAMIDOL (ISOVUE-300) INJECTION 61%
75.0000 mL | Freq: Once | INTRAVENOUS | Status: AC | PRN
  Administered 2019-06-28: 75 mL via INTRAVENOUS

## 2019-07-04 ENCOUNTER — Other Ambulatory Visit: Payer: 59

## 2019-10-18 DIAGNOSIS — Z1159 Encounter for screening for other viral diseases: Secondary | ICD-10-CM | POA: Diagnosis not present

## 2019-10-23 DIAGNOSIS — Z86010 Personal history of colonic polyps: Secondary | ICD-10-CM | POA: Diagnosis not present

## 2019-11-02 DIAGNOSIS — Z85828 Personal history of other malignant neoplasm of skin: Secondary | ICD-10-CM | POA: Diagnosis not present

## 2019-11-02 DIAGNOSIS — D225 Melanocytic nevi of trunk: Secondary | ICD-10-CM | POA: Diagnosis not present

## 2019-11-02 DIAGNOSIS — L570 Actinic keratosis: Secondary | ICD-10-CM | POA: Diagnosis not present

## 2019-11-02 DIAGNOSIS — D2272 Melanocytic nevi of left lower limb, including hip: Secondary | ICD-10-CM | POA: Diagnosis not present

## 2019-11-14 DIAGNOSIS — Z03818 Encounter for observation for suspected exposure to other biological agents ruled out: Secondary | ICD-10-CM | POA: Diagnosis not present

## 2019-11-14 DIAGNOSIS — Z20828 Contact with and (suspected) exposure to other viral communicable diseases: Secondary | ICD-10-CM | POA: Diagnosis not present

## 2019-11-20 DIAGNOSIS — S60453A Superficial foreign body of left middle finger, initial encounter: Secondary | ICD-10-CM | POA: Diagnosis not present

## 2019-11-23 DIAGNOSIS — S60453A Superficial foreign body of left middle finger, initial encounter: Secondary | ICD-10-CM | POA: Diagnosis not present

## 2020-02-27 DIAGNOSIS — L820 Inflamed seborrheic keratosis: Secondary | ICD-10-CM | POA: Diagnosis not present

## 2020-03-22 DIAGNOSIS — L03011 Cellulitis of right finger: Secondary | ICD-10-CM | POA: Diagnosis not present

## 2020-03-31 DIAGNOSIS — Z1231 Encounter for screening mammogram for malignant neoplasm of breast: Secondary | ICD-10-CM | POA: Diagnosis not present

## 2020-03-31 DIAGNOSIS — Z6821 Body mass index (BMI) 21.0-21.9, adult: Secondary | ICD-10-CM | POA: Diagnosis not present

## 2020-03-31 DIAGNOSIS — Z1151 Encounter for screening for human papillomavirus (HPV): Secondary | ICD-10-CM | POA: Diagnosis not present

## 2020-03-31 DIAGNOSIS — Z01419 Encounter for gynecological examination (general) (routine) without abnormal findings: Secondary | ICD-10-CM | POA: Diagnosis not present

## 2020-09-14 DIAGNOSIS — Z20822 Contact with and (suspected) exposure to covid-19: Secondary | ICD-10-CM | POA: Diagnosis not present

## 2020-09-14 DIAGNOSIS — J019 Acute sinusitis, unspecified: Secondary | ICD-10-CM | POA: Diagnosis not present

## 2020-09-14 DIAGNOSIS — R110 Nausea: Secondary | ICD-10-CM | POA: Diagnosis not present

## 2020-10-13 DIAGNOSIS — J328 Other chronic sinusitis: Secondary | ICD-10-CM | POA: Diagnosis not present

## 2020-11-05 DIAGNOSIS — L821 Other seborrheic keratosis: Secondary | ICD-10-CM | POA: Diagnosis not present

## 2020-11-05 DIAGNOSIS — Z85828 Personal history of other malignant neoplasm of skin: Secondary | ICD-10-CM | POA: Diagnosis not present

## 2020-11-05 DIAGNOSIS — L738 Other specified follicular disorders: Secondary | ICD-10-CM | POA: Diagnosis not present

## 2020-11-05 DIAGNOSIS — D225 Melanocytic nevi of trunk: Secondary | ICD-10-CM | POA: Diagnosis not present

## 2020-11-06 DIAGNOSIS — R7989 Other specified abnormal findings of blood chemistry: Secondary | ICD-10-CM | POA: Diagnosis not present

## 2020-11-06 DIAGNOSIS — Z0000 Encounter for general adult medical examination without abnormal findings: Secondary | ICD-10-CM | POA: Diagnosis not present

## 2020-11-10 DIAGNOSIS — Z1389 Encounter for screening for other disorder: Secondary | ICD-10-CM | POA: Diagnosis not present

## 2020-11-10 DIAGNOSIS — Z0000 Encounter for general adult medical examination without abnormal findings: Secondary | ICD-10-CM | POA: Diagnosis not present

## 2020-11-10 DIAGNOSIS — R030 Elevated blood-pressure reading, without diagnosis of hypertension: Secondary | ICD-10-CM | POA: Diagnosis not present

## 2020-11-10 DIAGNOSIS — D7589 Other specified diseases of blood and blood-forming organs: Secondary | ICD-10-CM | POA: Diagnosis not present

## 2020-11-10 DIAGNOSIS — F419 Anxiety disorder, unspecified: Secondary | ICD-10-CM | POA: Diagnosis not present

## 2020-11-10 DIAGNOSIS — R82998 Other abnormal findings in urine: Secondary | ICD-10-CM | POA: Diagnosis not present

## 2020-11-28 DIAGNOSIS — R030 Elevated blood-pressure reading, without diagnosis of hypertension: Secondary | ICD-10-CM | POA: Diagnosis not present

## 2021-06-09 DIAGNOSIS — Z1231 Encounter for screening mammogram for malignant neoplasm of breast: Secondary | ICD-10-CM | POA: Diagnosis not present

## 2021-07-06 DIAGNOSIS — Z85828 Personal history of other malignant neoplasm of skin: Secondary | ICD-10-CM | POA: Diagnosis not present

## 2021-07-06 DIAGNOSIS — L820 Inflamed seborrheic keratosis: Secondary | ICD-10-CM | POA: Diagnosis not present

## 2021-07-06 DIAGNOSIS — L570 Actinic keratosis: Secondary | ICD-10-CM | POA: Diagnosis not present

## 2021-07-06 DIAGNOSIS — L738 Other specified follicular disorders: Secondary | ICD-10-CM | POA: Diagnosis not present

## 2021-12-16 DIAGNOSIS — K769 Liver disease, unspecified: Secondary | ICD-10-CM | POA: Diagnosis not present

## 2021-12-23 DIAGNOSIS — Z23 Encounter for immunization: Secondary | ICD-10-CM | POA: Diagnosis not present

## 2021-12-23 DIAGNOSIS — F419 Anxiety disorder, unspecified: Secondary | ICD-10-CM | POA: Diagnosis not present

## 2021-12-23 DIAGNOSIS — Z1331 Encounter for screening for depression: Secondary | ICD-10-CM | POA: Diagnosis not present

## 2021-12-23 DIAGNOSIS — R350 Frequency of micturition: Secondary | ICD-10-CM | POA: Diagnosis not present

## 2021-12-23 DIAGNOSIS — Z79899 Other long term (current) drug therapy: Secondary | ICD-10-CM | POA: Diagnosis not present

## 2021-12-23 DIAGNOSIS — Z1389 Encounter for screening for other disorder: Secondary | ICD-10-CM | POA: Diagnosis not present

## 2021-12-23 DIAGNOSIS — R82998 Other abnormal findings in urine: Secondary | ICD-10-CM | POA: Diagnosis not present

## 2021-12-23 DIAGNOSIS — Z0000 Encounter for general adult medical examination without abnormal findings: Secondary | ICD-10-CM | POA: Diagnosis not present

## 2022-05-25 ENCOUNTER — Encounter: Payer: Self-pay | Admitting: Allergy and Immunology

## 2022-05-25 ENCOUNTER — Other Ambulatory Visit: Payer: Self-pay

## 2022-05-25 ENCOUNTER — Ambulatory Visit (INDEPENDENT_AMBULATORY_CARE_PROVIDER_SITE_OTHER): Payer: BC Managed Care – PPO | Admitting: Allergy and Immunology

## 2022-05-25 VITALS — BP 118/76 | HR 87 | Temp 98.2°F | Resp 18 | Ht 60.63 in | Wt 118.0 lb

## 2022-05-25 DIAGNOSIS — H1013 Acute atopic conjunctivitis, bilateral: Secondary | ICD-10-CM | POA: Diagnosis not present

## 2022-05-25 DIAGNOSIS — H10829 Rosacea conjunctivitis, unspecified eye: Secondary | ICD-10-CM

## 2022-05-25 DIAGNOSIS — J301 Allergic rhinitis due to pollen: Secondary | ICD-10-CM

## 2022-05-25 DIAGNOSIS — J3089 Other allergic rhinitis: Secondary | ICD-10-CM | POA: Diagnosis not present

## 2022-05-25 DIAGNOSIS — H1010 Acute atopic conjunctivitis, unspecified eye: Secondary | ICD-10-CM

## 2022-05-25 MED ORDER — OLOPATADINE HCL 0.2 % OP SOLN: 1.0000 [drp] | OPHTHALMIC | 5 refills | Status: AC

## 2022-05-25 MED ORDER — CETIRIZINE HCL 10 MG PO TABS: 10.0000 mg | ORAL_TABLET | Freq: Every day | ORAL | 5 refills | Status: DC

## 2022-05-25 MED ORDER — SYSTANE COMPLETE 0.6 % OP SOLN: 1.0000 [drp] | Freq: Four times a day (QID) | OPHTHALMIC | 5 refills | Status: AC | PRN

## 2022-05-25 MED ORDER — TRIAMCINOLONE ACETONIDE 55 MCG/ACT NA AERO: 2.0000 | INHALATION_SPRAY | Freq: Every day | NASAL | 5 refills | Status: AC

## 2022-05-25 MED ORDER — METRONIDAZOLE 0.75 % EX CREA: TOPICAL_CREAM | Freq: Two times a day (BID) | CUTANEOUS | 1 refills | Status: AC

## 2022-05-25 MED ORDER — DOXYCYCLINE HYCLATE 50 MG PO CAPS: 50.0000 mg | ORAL_CAPSULE | Freq: Every day | ORAL | 5 refills | Status: AC

## 2022-05-25 NOTE — Progress Notes (Signed)
[facility]   Dear Minna Antis,  Thank you for referring Breanna Hartman to the [facility] on 05/25/2022.   Below is a summation of this patient's evaluation and recommendations.  Thank you for your referral. I will keep you informed about this patient's response to treatment.   If you have any questions please do not hesitate to contact me.   Sincerely,  Jiles Prows, MD Allergy / Immunology [facility]   ______________________________________________________________________    NEW PATIENT NOTE  Referring Provider: Tommy Medal, MD Primary Provider: Tommy Medal, MD Date of office visit: 05/25/2022    Subjective:   Chief Complaint:  Breanna Hartman (DOB: 16-Aug-1966) is a 56 y.o. female who presents to the clinic on 05/25/2022 with a chief complaint of No chief complaint on file. Marland Kitchen     HPI: Breanna Hartman presents to this clinic in evaluation of allergies.  She has a long history of allergic disease and when she moved to [place] from [place] about 20 years ago she started a course of immunotherapy for approximately 4 years which resulted in resolution of her nasal and eye issues.  She would occasionally use some Allegra and some Claritin and she had slow return of her symptoms but this past year has been the worst.  She states that this past year from August 2022 on she has had lots of problems involving not just her eyes and nose but her skin.  She has very itchy face and itchy scalp.  She has bloodshot eyes and irritated eyes and gritty feeling eyes.  She has sneezing and runny nose and a little bit of cough and throat clearing in the morning.  She does not have any anosmia or headaches or ugly nasal discharge or raspy voice or swallowing problems or reflux issues.  She has never been told that she has dry eye syndrome.  She does have a history  of very easily flushing and blushing on her face.  She has used various types of antihistamines.  She is not a big fan of nasal steroids.  She occasionally uses some nasal saline.  She will use some Systane.  She has been using some topical decongestant eyedrops.  Past Medical History:  Diagnosis Date  . CIN I (cervical intraepithelial neoplasia I) 2006   LEEP    Past Surgical History:  Procedure Laterality Date  . ANTERIOR AND POSTERIOR VAGINAL REPAIR  2004  . AUGMENTATION MAMMAPLASTY  2004   Implants  . CERVICAL BIOPSY  W/ LOOP ELECTRODE EXCISION    . COLPOSCOPY      Allergies as of 05/25/2022   No Known Allergies      Medication List    citalopram 40 MG tablet Commonly known as: CELEXA FOLLOW DIRECTIONS PROVIDED BY PHYSICIAN   Conj Estrogens-Bazedoxifene 0.45-20 MG Tabs Take 1 tablet by mouth daily.   diazepam 5 MG tablet Commonly known as: VALIUM Take 1 tablet (5 mg total) by mouth every 6 (six) hours as needed.   estradiol-norethindrone 1-0.5 MG tablet Commonly known as: ACTIVELLA Take 1 tablet by mouth daily.   PROBIOTIC FORMULA PO Take by mouth.    Review of systems negative except as noted in HPI / PMHx or noted below:  Review of Systems  Constitutional: Negative.   HENT: Negative.    Eyes: Negative.   Respiratory: Negative.    Cardiovascular: Negative.  Gastrointestinal: Negative.   Genitourinary: Negative.   Musculoskeletal: Negative.   Skin: Negative.   Neurological: Negative.   Endo/Heme/Allergies: Negative.   Psychiatric/Behavioral: Negative.      Family History  Problem Relation Age of Onset  . Leukemia Father     Social History   Socioeconomic History  . Marital status: Married    Spouse name: Not on file  . Number of children: Not on file  . Years of education: Not on file  . Highest education level: Not on file  Occupational History  . Not on file  Tobacco Use  . Smoking status: Former  . Smokeless tobacco: Not on file   Substance and Sexual Activity  . Alcohol use: Yes    Alcohol/week: 12.0 standard drinks of alcohol    Types: 12 drink(s) per week  . Drug use: No  . Sexual activity: Yes    Birth control/protection: Other-see comments, Post-menopausal    Comment: Vasectomy  Other Topics Concern  . Not on file  Social History Narrative  . Not on file   Environmental and Social history  Lives in a house with a dry environment, a dog located inside the household, carpet in the bedroom, stick on the bed, plastic on the pillow, and no smoking ongoing with inside the household.  Objective:   Vitals:   05/25/22 1416  BP: 118/76  Pulse: 87  Resp: 18  Temp: 98.2 F (36.8 C)  SpO2: 98%   Height: 5' 0.63" (154 cm) Weight: 118 lb (53.5 kg)  Physical Exam Constitutional:      Appearance: She is not diaphoretic.  HENT:     Head: Normocephalic.     Right Ear: Tympanic membrane, ear canal and external ear normal.     Left Ear: Tympanic membrane, ear canal and external ear normal.     Nose: Nose normal. No mucosal edema or rhinorrhea.     Mouth/Throat:     Pharynx: Uvula midline. No oropharyngeal exudate.  Eyes:     Conjunctiva/sclera:     Right eye: Right conjunctiva is injected.     Left eye: Left conjunctiva is injected.  Neck:     Thyroid: No thyromegaly.     Trachea: Trachea normal. No tracheal tenderness or tracheal deviation.  Cardiovascular:     Rate and Rhythm: Normal rate and regular rhythm.     Heart sounds: Normal heart sounds, S1 normal and S2 normal. No murmur heard. Pulmonary:     Effort: No respiratory distress.     Breath sounds: Normal breath sounds. No stridor. No wheezing or rales.  Lymphadenopathy:     Head:     Right side of head: No tonsillar adenopathy.     Left side of head: No tonsillar adenopathy.     Cervical: No cervical adenopathy.  Skin:    Findings: Rash (Facial erythema with nasal cystic lesions and papules) present. No erythema.     Nails: There is no  clubbing.  Neurological:     Mental Status: She is alert.    Diagnostics: Allergy skin tests were performed.   Assessment and Plan:    No diagnosis found.  There are no Patient Instructions on file for this visit.   Jiles Prows, MD Allergy / Immunology [facility]

## 2022-05-25 NOTE — Patient Instructions (Signed)
  1.  Allergen avoidance measures  2.  Treat and prevent inflammation:   A. Nasacort - 1-2 sprays each nostril 1 time per day  3.  Treat and prevent rosacea /blepharitis:   A. Doxycycline 50 mg - 1 tablet 1 time per day  B. Metrocream applied to face 2 times per day  4.  If needed:   A. OTC antihistamine -loratadine / cetirizine / fexofenadine  B. OTC Pataday - 1 drop each eye 1 time per day  C. Systane  D. Nasal saline  5.  Return to clinic in 4 weeks or earlier if problem  6.  Immunotherapy???  7.  Plan for fall flu vaccine

## 2022-05-26 ENCOUNTER — Encounter: Payer: Self-pay | Admitting: Allergy and Immunology

## 2022-06-18 ENCOUNTER — Other Ambulatory Visit: Payer: Self-pay | Admitting: Allergy and Immunology

## 2022-06-22 ENCOUNTER — Ambulatory Visit (INDEPENDENT_AMBULATORY_CARE_PROVIDER_SITE_OTHER): Payer: BC Managed Care – PPO | Admitting: Family Medicine

## 2022-06-22 ENCOUNTER — Encounter: Payer: Self-pay | Admitting: Family Medicine

## 2022-06-22 ENCOUNTER — Ambulatory Visit: Payer: PRIVATE HEALTH INSURANCE | Admitting: Allergy and Immunology

## 2022-06-22 DIAGNOSIS — H1013 Acute atopic conjunctivitis, bilateral: Secondary | ICD-10-CM | POA: Diagnosis not present

## 2022-06-22 DIAGNOSIS — J301 Allergic rhinitis due to pollen: Secondary | ICD-10-CM | POA: Insufficient documentation

## 2022-06-22 DIAGNOSIS — H1010 Acute atopic conjunctivitis, unspecified eye: Secondary | ICD-10-CM | POA: Insufficient documentation

## 2022-06-22 MED ORDER — CROMOLYN SODIUM 4 % OP SOLN: 2.0000 [drp] | Freq: Four times a day (QID) | OPHTHALMIC | 5 refills | Status: AC | PRN

## 2022-06-22 MED ORDER — CARBINOXAMINE MALEATE 4 MG PO TABS: ORAL_TABLET | ORAL | 5 refills | Status: DC

## 2022-06-22 NOTE — Patient Instructions (Signed)
Allergic rhinitis Continue allergen avoidance measures directed toward tree pollen, grass pollen, weed pollen, ragweed pollen and cat as listed below Begin carbinoxamine 4 mg tablets.  Take 1 to 2 tablets twice a day as needed for runny nose or itch.  This will replace cetirizine Begin Ryaltris nasal spray 2 sprays in each nostril twice a day as needed for runny nose.  This will replace Nasacort Consider saline nasal rinses as needed for nasal symptoms. Use this before any medicated nasal sprays for best result Consider allergen immunotherapy if the symptoms are not well controlled with the treatment as listed above.  Call the clinic to make an appointment for your first allergy injection.  Allergic conjunctivitis Begin cromolyn eyedrops 2 drops in each eye up to 4 times a day as needed for red or itchy eyes.  This will replace olopatadine Continue a lubricating eyedrop.  Use the lubricating eyedrop to wash your eye before using cromolyn eyedrops for allergy relief  Call the clinic if this treatment plan is not working well for you  Follow up in 3 months or sooner if needed.  Reducing Pollen Exposure The American Academy of Allergy, Asthma and Immunology suggests the following steps to reduce your exposure to pollen during allergy seasons. Do not hang sheets or clothing out to dry; pollen may collect on these items. Do not mow lawns or spend time around freshly cut grass; mowing stirs up pollen. Keep windows closed at night.  Keep car windows closed while driving. Minimize morning activities outdoors, a time when pollen counts are usually at their highest. Stay indoors as much as possible when pollen counts or humidity is high and on windy days when pollen tends to remain in the air longer. Use air conditioning when possible.  Many air conditioners have filters that trap the pollen spores. Use a HEPA room air filter to remove pollen form the indoor air you breathe.  Control of Dog or Cat  Allergen Avoidance is the best way to manage a dog or cat allergy. If you have a dog or cat and are allergic to dog or cats, consider removing the dog or cat from the home. If you have a dog or cat but don't want to find it a new home, or if your family wants a pet even though someone in the household is allergic, here are some strategies that may help keep symptoms at bay:  Keep the pet out of your bedroom and restrict it to only a few rooms. Be advised that keeping the dog or cat in only one room will not limit the allergens to that room. Don't pet, hug or kiss the dog or cat; if you do, wash your hands with soap and water. High-efficiency particulate air (HEPA) cleaners run continuously in a bedroom or living room can reduce allergen levels over time. Regular use of a high-efficiency vacuum cleaner or a central vacuum can reduce allergen levels. Giving your dog or cat a bath at least once a week can reduce airborne allergen.

## 2022-06-22 NOTE — Progress Notes (Signed)
RE: LUNDEN STIEBER MRN: [id number] DOB: 01-02-1966 Date of Telemedicine Visit: 06/22/2022  Referring provider: Crist Infante, MD Primary care provider: Crist Infante, MD  Chief Complaint: Allergic Rhinitis    Telemedicine Follow Up Visit via Telephone: I connected with Kern Alberta for a follow up on 06/22/22 by telephone and verified that I am speaking with the correct person using two identifiers.   I discussed the limitations, risks, security and privacy concerns of performing an evaluation and management service by telephone and the availability of in person appointments. I also discussed with the patient that there may be a patient responsible charge related to this service. The patient expressed understanding and agreed to proceed.  Patient is at home  Provider is at the office.  Visit start time: 30 Visit end time: [place] consent/check in by: Mickel Baas Medical consent and medical assistant/nurse: Morey Hummingbird  History of Present Illness: She is a 56 y.o. female, who is being followed for allergic rhinitis, allergic conjunctivitis, and rosacea. Her previous allergy office visit was on 05/25/2022 with Dr. Neldon Mc.  At today's visit, she reports her allergic rhinitis has been poorly controlled with symptoms reported as worse than at her last visit including clear rhinorrhea, sneezing, and postnasal drainage.  At this time, she continues cetirizine 10 mg once a day.  She has tried Nasacort which she reports increased nasal drainage and occasionally uses saline nasal rinses which she reports are not effective for her.  She has previously received allergen immunotherapy for 4 years about 20 years ago with excellent results.  She is interested in beginning allergen immunotherapy, however, she has a Dealer and is considering waiting until January 2024 to begin allergen immunotherapy.  Her last environmental allergy skin testing was on 05/25/2022 and was positive to grass pollen,  weed pollen, ragweed pollen, tree pollen, and cat. She reports widespread pruritus which has not improved since her last visit to this clinic.  Allergic conjunctivitis is reported as poorly controlled with symptoms including red, itching, and burning eyes.  She continues Systane and olopatadine with no relief of symptoms.  She reports symptoms of facial redness and facial flushing have worsened since beginning doxycycline and MetroGel and is interested in stopping this therapy at this time.  Her current medications are listed in the chart.  Assessment and Plan: Sarena is a 56 y.o. female with: Patient Instructions  Allergic rhinitis Continue allergen avoidance measures directed toward tree pollen, grass pollen, weed pollen, ragweed pollen and cat as listed below Begin carbinoxamine 4 mg tablets.  Take 1 to 2 tablets twice a day as needed for runny nose or itch.  This will replace cetirizine Begin Ryaltris nasal spray 2 sprays in each nostril twice a day as needed for runny nose.  This will replace Nasacort Consider saline nasal rinses as needed for nasal symptoms. Use this before any medicated nasal sprays for best result Consider allergen immunotherapy if the symptoms are not well controlled with the treatment as listed above.  Call the clinic to make an appointment for your first allergy injection.  Allergic conjunctivitis Begin cromolyn eyedrops 2 drops in each eye up to 4 times a day as needed for red or itchy eyes.  This will replace olopatadine Continue a lubricating eyedrop.  Use the lubricating eyedrop to wash your eye before using cromolyn eyedrops for allergy relief  Call the clinic if this treatment plan is not working well for you  Follow up in 3 months or sooner if needed.  Return in about 3 months (around 09/21/2022), or if symptoms worsen or fail to improve.  Meds ordered this encounter  Medications   Carbinoxamine Maleate 4 MG TABS    Sig: Take 2 tablets twice a day as  needed for a runny nose or itch    Dispense:  120 tablet    Refill:  5   cromolyn (OPTICROM) 4 % ophthalmic solution    Sig: Place 2 drops into both eyes 4 (four) times daily as needed.    Dispense:  10 mL    Refill:  5    Medication List:  Current Outpatient Medications  Medication Sig Dispense Refill   Carbinoxamine Maleate 4 MG TABS Take 2 tablets twice a day as needed for a runny nose or itch 120 tablet 5   cromolyn (OPTICROM) 4 % ophthalmic solution Place 2 drops into both eyes 4 (four) times daily as needed. 10 mL 5   citalopram (CELEXA) 40 MG tablet FOLLOW DIRECTIONS PROVIDED BY PHYSICIAN 30 tablet 8   Conj Estrogens-Bazedoxifene 0.45-20 MG TABS Take 1 tablet by mouth daily. 30 tablet 11   diazepam (VALIUM) 5 MG tablet Take 1 tablet (5 mg total) by mouth every 6 (six) hours as needed. 30 tablet 0   doxycycline (VIBRAMYCIN) 50 MG capsule Take 1 capsule (50 mg total) by mouth daily. 30 capsule 5   estradiol-norethindrone (ACTIVELLA) 1-0.5 MG per tablet Take 1 tablet by mouth daily. (Patient not taking: Reported on 05/25/2022) 30 tablet 11   metroNIDAZOLE (METROCREAM) 0.75 % cream Apply topically 2 (two) times daily. 45 g 1   Olopatadine HCl (PATADAY) 0.2 % SOLN Place 1 drop into both eyes 1 day or 1 dose. 2.5 mL 5   Probiotic Product (PROBIOTIC FORMULA PO) Take by mouth.       Propylene Glycol (SYSTANE COMPLETE) 0.6 % SOLN Apply 1 drop to eye 4 (four) times daily as needed. 15 mL 5   QSYMIA 11.25-69 MG CP24 Take 1 capsule by mouth daily.     triamcinolone (NASACORT) 55 MCG/ACT AERO nasal inhaler Place 2 sprays into the nose daily. 17 g 5   No current facility-administered medications for this visit.   Allergies: No Known Allergies I reviewed her past medical history, social history, family history, and environmental history and no significant changes have been reported from previous visit on 05/25/2022.  Objective: Physical Exam Not obtained as encounter was done via telephone.    Previous notes and tests were reviewed.  I discussed the assessment and treatment plan with the patient. The patient was provided an opportunity to ask questions and all were answered. The patient agreed with the plan and demonstrated an understanding of the instructions.   The patient was advised to call back or seek an in-person evaluation if the symptoms worsen or if the condition fails to improve as anticipated.  I provided 28 minutes of non-face-to-face time during this encounter.  It was my pleasure to participate in Shilpa Bushee care today. Please feel free to contact me with any questions or concerns.   Sincerely,  Gareth Morgan, FNP

## 2022-07-19 DIAGNOSIS — Z85828 Personal history of other malignant neoplasm of skin: Secondary | ICD-10-CM | POA: Diagnosis not present

## 2022-07-19 DIAGNOSIS — L738 Other specified follicular disorders: Secondary | ICD-10-CM | POA: Diagnosis not present

## 2022-07-19 DIAGNOSIS — D485 Neoplasm of uncertain behavior of skin: Secondary | ICD-10-CM | POA: Diagnosis not present

## 2022-07-19 DIAGNOSIS — D045 Carcinoma in situ of skin of trunk: Secondary | ICD-10-CM | POA: Diagnosis not present

## 2022-07-19 DIAGNOSIS — L570 Actinic keratosis: Secondary | ICD-10-CM | POA: Diagnosis not present

## 2022-07-19 DIAGNOSIS — L814 Other melanin hyperpigmentation: Secondary | ICD-10-CM | POA: Diagnosis not present

## 2022-07-21 ENCOUNTER — Other Ambulatory Visit: Payer: Self-pay | Admitting: *Deleted

## 2022-07-21 MED ORDER — CARBINOXAMINE MALEATE 4 MG PO TABS: ORAL_TABLET | ORAL | 1 refills | Status: AC

## 2022-07-29 DIAGNOSIS — Z1231 Encounter for screening mammogram for malignant neoplasm of breast: Secondary | ICD-10-CM | POA: Diagnosis not present

## 2022-07-29 DIAGNOSIS — Z01419 Encounter for gynecological examination (general) (routine) without abnormal findings: Secondary | ICD-10-CM | POA: Diagnosis not present

## 2022-07-29 DIAGNOSIS — Z124 Encounter for screening for malignant neoplasm of cervix: Secondary | ICD-10-CM | POA: Diagnosis not present

## 2022-07-29 DIAGNOSIS — Z6821 Body mass index (BMI) 21.0-21.9, adult: Secondary | ICD-10-CM | POA: Diagnosis not present

## 2023-01-20 DIAGNOSIS — M8589 Other specified disorders of bone density and structure, multiple sites: Secondary | ICD-10-CM | POA: Diagnosis not present

## 2023-01-20 DIAGNOSIS — R7989 Other specified abnormal findings of blood chemistry: Secondary | ICD-10-CM | POA: Diagnosis not present

## 2023-01-20 DIAGNOSIS — F419 Anxiety disorder, unspecified: Secondary | ICD-10-CM | POA: Diagnosis not present

## 2023-01-20 DIAGNOSIS — R030 Elevated blood-pressure reading, without diagnosis of hypertension: Secondary | ICD-10-CM | POA: Diagnosis not present

## 2023-01-27 DIAGNOSIS — R030 Elevated blood-pressure reading, without diagnosis of hypertension: Secondary | ICD-10-CM | POA: Diagnosis not present

## 2023-01-27 DIAGNOSIS — Z1331 Encounter for screening for depression: Secondary | ICD-10-CM | POA: Diagnosis not present

## 2023-01-27 DIAGNOSIS — R82998 Other abnormal findings in urine: Secondary | ICD-10-CM | POA: Diagnosis not present

## 2023-01-27 DIAGNOSIS — Z0000 Encounter for general adult medical examination without abnormal findings: Secondary | ICD-10-CM | POA: Diagnosis not present

## 2023-01-27 DIAGNOSIS — J309 Allergic rhinitis, unspecified: Secondary | ICD-10-CM | POA: Diagnosis not present

## 2023-01-27 DIAGNOSIS — K769 Liver disease, unspecified: Secondary | ICD-10-CM | POA: Diagnosis not present

## 2023-01-27 DIAGNOSIS — M8580 Other specified disorders of bone density and structure, unspecified site: Secondary | ICD-10-CM | POA: Diagnosis not present

## 2023-01-27 DIAGNOSIS — F419 Anxiety disorder, unspecified: Secondary | ICD-10-CM | POA: Diagnosis not present

## 2023-05-06 DIAGNOSIS — L03032 Cellulitis of left toe: Secondary | ICD-10-CM | POA: Diagnosis not present

## 2023-05-12 DIAGNOSIS — L03032 Cellulitis of left toe: Secondary | ICD-10-CM | POA: Diagnosis not present

## 2023-05-12 DIAGNOSIS — L02612 Cutaneous abscess of left foot: Secondary | ICD-10-CM | POA: Diagnosis not present

## 2023-05-26 DIAGNOSIS — L600 Ingrowing nail: Secondary | ICD-10-CM | POA: Diagnosis not present

## 2023-06-02 DIAGNOSIS — L03032 Cellulitis of left toe: Secondary | ICD-10-CM | POA: Diagnosis not present

## 2023-07-25 DIAGNOSIS — L820 Inflamed seborrheic keratosis: Secondary | ICD-10-CM | POA: Diagnosis not present

## 2023-07-25 DIAGNOSIS — D045 Carcinoma in situ of skin of trunk: Secondary | ICD-10-CM | POA: Diagnosis not present

## 2023-07-25 DIAGNOSIS — Z85828 Personal history of other malignant neoplasm of skin: Secondary | ICD-10-CM | POA: Diagnosis not present

## 2023-07-25 DIAGNOSIS — D2262 Melanocytic nevi of left upper limb, including shoulder: Secondary | ICD-10-CM | POA: Diagnosis not present

## 2023-07-25 DIAGNOSIS — D225 Melanocytic nevi of trunk: Secondary | ICD-10-CM | POA: Diagnosis not present

## 2023-07-25 DIAGNOSIS — L570 Actinic keratosis: Secondary | ICD-10-CM | POA: Diagnosis not present

## 2023-08-03 DIAGNOSIS — Z1331 Encounter for screening for depression: Secondary | ICD-10-CM | POA: Diagnosis not present

## 2023-08-03 DIAGNOSIS — Z1231 Encounter for screening mammogram for malignant neoplasm of breast: Secondary | ICD-10-CM | POA: Diagnosis not present

## 2023-08-03 DIAGNOSIS — Z01419 Encounter for gynecological examination (general) (routine) without abnormal findings: Secondary | ICD-10-CM | POA: Diagnosis not present

## 2023-08-12 DIAGNOSIS — W260XXA Contact with knife, initial encounter: Secondary | ICD-10-CM | POA: Diagnosis not present

## 2023-08-12 DIAGNOSIS — Y998 Other external cause status: Secondary | ICD-10-CM | POA: Diagnosis not present

## 2023-08-12 DIAGNOSIS — Y92009 Unspecified place in unspecified non-institutional (private) residence as the place of occurrence of the external cause: Secondary | ICD-10-CM | POA: Diagnosis not present

## 2023-08-12 DIAGNOSIS — Y9289 Other specified places as the place of occurrence of the external cause: Secondary | ICD-10-CM | POA: Diagnosis not present

## 2023-08-12 DIAGNOSIS — S61217A Laceration without foreign body of left little finger without damage to nail, initial encounter: Secondary | ICD-10-CM | POA: Diagnosis not present

## 2023-08-12 DIAGNOSIS — Y93G1 Activity, food preparation and clean up: Secondary | ICD-10-CM | POA: Diagnosis not present

## 2023-08-23 DIAGNOSIS — M79642 Pain in left hand: Secondary | ICD-10-CM | POA: Diagnosis not present

## 2023-08-23 DIAGNOSIS — S61412A Laceration without foreign body of left hand, initial encounter: Secondary | ICD-10-CM | POA: Diagnosis not present

## 2024-02-21 DIAGNOSIS — Z1212 Encounter for screening for malignant neoplasm of rectum: Secondary | ICD-10-CM | POA: Diagnosis not present

## 2024-02-21 DIAGNOSIS — R7989 Other specified abnormal findings of blood chemistry: Secondary | ICD-10-CM | POA: Diagnosis not present

## 2024-02-24 DIAGNOSIS — R82998 Other abnormal findings in urine: Secondary | ICD-10-CM | POA: Diagnosis not present

## 2024-02-24 DIAGNOSIS — Z1339 Encounter for screening examination for other mental health and behavioral disorders: Secondary | ICD-10-CM | POA: Diagnosis not present

## 2024-02-24 DIAGNOSIS — K769 Liver disease, unspecified: Secondary | ICD-10-CM | POA: Diagnosis not present

## 2024-02-24 DIAGNOSIS — Z1389 Encounter for screening for other disorder: Secondary | ICD-10-CM | POA: Diagnosis not present

## 2024-02-24 DIAGNOSIS — D7589 Other specified diseases of blood and blood-forming organs: Secondary | ICD-10-CM | POA: Diagnosis not present

## 2024-02-24 DIAGNOSIS — Z0000 Encounter for general adult medical examination without abnormal findings: Secondary | ICD-10-CM | POA: Diagnosis not present

## 2024-02-24 DIAGNOSIS — Z1331 Encounter for screening for depression: Secondary | ICD-10-CM | POA: Diagnosis not present

## 2024-02-28 ENCOUNTER — Other Ambulatory Visit: Payer: Self-pay | Admitting: Internal Medicine

## 2024-02-28 DIAGNOSIS — K769 Liver disease, unspecified: Secondary | ICD-10-CM

## 2024-03-06 DIAGNOSIS — H02831 Dermatochalasis of right upper eyelid: Secondary | ICD-10-CM | POA: Diagnosis not present

## 2024-03-06 DIAGNOSIS — H02834 Dermatochalasis of left upper eyelid: Secondary | ICD-10-CM | POA: Diagnosis not present

## 2024-03-06 DIAGNOSIS — R238 Other skin changes: Secondary | ICD-10-CM | POA: Diagnosis not present

## 2024-03-06 DIAGNOSIS — H02832 Dermatochalasis of right lower eyelid: Secondary | ICD-10-CM | POA: Diagnosis not present

## 2024-05-24 ENCOUNTER — Other Ambulatory Visit: Payer: Self-pay | Admitting: Internal Medicine

## 2024-05-24 DIAGNOSIS — K769 Liver disease, unspecified: Secondary | ICD-10-CM

## 2024-05-24 DIAGNOSIS — R1310 Dysphagia, unspecified: Secondary | ICD-10-CM | POA: Diagnosis not present

## 2024-05-29 ENCOUNTER — Ambulatory Visit
Admission: RE | Admit: 2024-05-29 | Discharge: 2024-05-29 | Disposition: A | Discharge status: Home / Self Care | Source: Ambulatory Visit | Attending: Internal Medicine | Admitting: Internal Medicine

## 2024-05-29 DIAGNOSIS — K769 Liver disease, unspecified: Secondary | ICD-10-CM

## 2024-05-29 DIAGNOSIS — K7689 Other specified diseases of liver: Secondary | ICD-10-CM | POA: Diagnosis not present

## 2024-06-12 ENCOUNTER — Other Ambulatory Visit: Payer: Self-pay | Admitting: Gastroenterology

## 2024-06-12 DIAGNOSIS — R1310 Dysphagia, unspecified: Secondary | ICD-10-CM | POA: Diagnosis not present

## 2024-06-12 DIAGNOSIS — R140 Abdominal distension (gaseous): Secondary | ICD-10-CM | POA: Diagnosis not present

## 2024-06-12 DIAGNOSIS — R194 Change in bowel habit: Secondary | ICD-10-CM | POA: Diagnosis not present

## 2024-07-03 DIAGNOSIS — H10413 Chronic giant papillary conjunctivitis, bilateral: Secondary | ICD-10-CM | POA: Diagnosis not present

## 2024-07-12 ENCOUNTER — Other Ambulatory Visit

## 2024-07-24 DIAGNOSIS — L814 Other melanin hyperpigmentation: Secondary | ICD-10-CM | POA: Diagnosis not present

## 2024-07-24 DIAGNOSIS — L821 Other seborrheic keratosis: Secondary | ICD-10-CM | POA: Diagnosis not present

## 2024-07-24 DIAGNOSIS — D2262 Melanocytic nevi of left upper limb, including shoulder: Secondary | ICD-10-CM | POA: Diagnosis not present

## 2024-07-24 DIAGNOSIS — Z85828 Personal history of other malignant neoplasm of skin: Secondary | ICD-10-CM | POA: Diagnosis not present

## 2024-09-06 ENCOUNTER — Ambulatory Visit
Admission: RE | Admit: 2024-09-06 | Discharge: 2024-09-06 | Disposition: A | Source: Ambulatory Visit | Attending: Gastroenterology | Admitting: Gastroenterology

## 2024-09-06 DIAGNOSIS — R1310 Dysphagia, unspecified: Secondary | ICD-10-CM | POA: Diagnosis not present
